# Patient Record
Sex: Female | Born: 1965 | Race: White | Hispanic: No | Marital: Married | Smoking: Never smoker
Health system: Southern US, Community
[De-identification: ages and names within clinical notes are randomized; demographics above are authoritative.]

## PROBLEM LIST (undated history)

## (undated) DIAGNOSIS — R7611 Nonspecific reaction to tuberculin skin test without active tuberculosis: Secondary | ICD-10-CM

## (undated) DIAGNOSIS — H7390 Unspecified disorder of tympanic membrane, unspecified ear: Secondary | ICD-10-CM

## (undated) DIAGNOSIS — E042 Nontoxic multinodular goiter: Secondary | ICD-10-CM

## (undated) HISTORY — PX: NO PAST SURGERIES: SHX2092

## (undated) HISTORY — DX: Nontoxic multinodular goiter: E04.2

## (undated) HISTORY — DX: Unspecified disorder of tympanic membrane, unspecified ear: H73.90

## (undated) HISTORY — DX: Nonspecific reaction to tuberculin skin test without active tuberculosis: R76.11

## (undated) HISTORY — PX: ENDOMETRIAL ABLATION: SHX621

---

## 1996-07-21 ENCOUNTER — Encounter: Payer: Self-pay | Admitting: Internal Medicine

## 2008-01-07 DIAGNOSIS — H7390 Unspecified disorder of tympanic membrane, unspecified ear: Secondary | ICD-10-CM

## 2008-01-07 HISTORY — DX: Unspecified disorder of tympanic membrane, unspecified ear: H73.90

## 2008-03-13 ENCOUNTER — Ambulatory Visit: Payer: Self-pay | Admitting: Internal Medicine

## 2008-10-28 ENCOUNTER — Ambulatory Visit: Payer: Self-pay | Admitting: Internal Medicine

## 2008-10-28 DIAGNOSIS — J019 Acute sinusitis, unspecified: Secondary | ICD-10-CM

## 2008-10-28 DIAGNOSIS — H6980 Other specified disorders of Eustachian tube, unspecified ear: Secondary | ICD-10-CM

## 2009-02-15 ENCOUNTER — Ambulatory Visit: Payer: Self-pay | Admitting: Internal Medicine

## 2009-02-15 DIAGNOSIS — J069 Acute upper respiratory infection, unspecified: Secondary | ICD-10-CM | POA: Insufficient documentation

## 2009-02-19 ENCOUNTER — Telehealth: Payer: Self-pay | Admitting: Internal Medicine

## 2009-03-06 ENCOUNTER — Ambulatory Visit: Payer: Self-pay | Admitting: Internal Medicine

## 2009-03-06 ENCOUNTER — Telehealth: Payer: Self-pay

## 2009-03-06 DIAGNOSIS — L738 Other specified follicular disorders: Secondary | ICD-10-CM

## 2009-05-14 ENCOUNTER — Telehealth: Payer: Self-pay | Admitting: Internal Medicine

## 2009-06-27 ENCOUNTER — Encounter: Admission: RE | Admit: 2009-06-27 | Discharge: 2009-06-27 | Payer: Self-pay | Admitting: Obstetrics and Gynecology

## 2009-08-07 ENCOUNTER — Encounter: Admission: RE | Admit: 2009-08-07 | Discharge: 2009-08-07 | Payer: Self-pay | Admitting: Obstetrics and Gynecology

## 2010-02-05 NOTE — Assessment & Plan Note (Signed)
Summary: HEAD AND CHEST CONGESTION/COUGH/CJR   Vital Signs:  Patient profile:   45 year old female Weight:      135 pounds Temp:     98.6 degrees F oral BP sitting:   110 / 70  (left arm) Cuff size:   regular  Vitals Entered By: Duard Brady RN (February 15, 2009 10:10 AM) CC: c/o cough - waking , congestion head and chest , low grade fever , son sick last wk    CC:  c/o cough - waking , congestion head and chest , low grade fever , and son sick last wk .  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  She presents with a one week history of head and chest congestion.  Her chief complaint is cough that interferes with sleep and there is an earlier fever.  That has resolved.  She does have some nasal drainage, but no productive cough.  Denies any wheezing or shortness of breath.  She has had a history of sinus infection and also ruptured  tympanic membrane in the past  Allergies: No Known Drug Allergies  Past History:  Past Medical History: Reviewed history from 10/28/2008 and no changes required. 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  The patient denies weight loss, weight gain, vision loss, decreased hearing, hoarseness, chest pain, syncope, dyspnea on exertion, peripheral edema, headaches, hemoptysis, abdominal pain, melena, hematochezia, severe indigestion/heartburn, hematuria, incontinence, genital sores, muscle weakness, suspicious skin lesions, transient blindness, difficulty walking, depression, unusual weight change, abnormal bleeding, enlarged lymph nodes, angioedema, and breast masses.    Physical Exam  General:  Well-developed,well-nourished,in no acute distress; alert,appropriate and cooperative throughout examination Head:  Normocephalic and  atraumatic without obvious abnormalities. No apparent alopecia or balding. Eyes:  No corneal or conjunctival inflammation noted. EOMI. Perrla. Funduscopic exam benign, without hemorrhages, exudates or papilledema. Vision grossly normal. Ears:  External ear exam shows no significant lesions or deformities.  Otoscopic examination reveals clear canals, tympanic membranes are intact bilaterally without bulging, retraction, inflammation or discharge. Hearing is grossly normal bilaterally. Mouth:  Oral mucosa and oropharynx without lesions or exudates.  Teeth in good repair. Neck:  No deformities, masses, or tenderness noted. Lungs:  rare scattered rhonchinormal respiratory effort, no intercostal retractions, and no accessory muscle use.  normal respiratory effort, no intercostal retractions, and no accessory muscle use.   Heart:  Normal rate and regular rhythm. S1 and S2 normal without gallop, murmur, click, rub or other extra sounds.   Complete Medication List: 1)  Hydrocodone-homatropine 5-1.5 Mg/33ml Syrp (Hydrocodone-homatropine) .Marland Kitchen.. 1 teaspoon every 6 hours as needed for cough  Patient Instructions: 1)  Get plenty of rest, drink lots of clear liquids, and use Tylenol or Ibuprofen for fever and comfort. Return in 7-10 days if you're not better:sooner if you're feeling worse. Prescriptions: HYDROCODONE-HOMATROPINE 5-1.5 MG/5ML SYRP (HYDROCODONE-HOMATROPINE) 1 teaspoon every 6 hours as needed for cough  #6 oz x 0   Entered and Authorized by:   Gordy Savers  MD   Signed by:   Gordy Savers  MD on 02/15/2009   Method used:   Print then Give to Patient   RxID:   [id number]   Appended Document: HEAD AND CHEST CONGESTION/COUGH/CJR Impression:  Viral  URI  Cough  Plan:  will treat symptomatically with anti-tussives, force fluids, rest, and anti-inflammatories

## 2010-02-05 NOTE — Progress Notes (Signed)
Summary:   [facility] 02/19/2009  Phone Note Call from Patient   Caller: Patient Call For: Gordy Savers  MD Summary of Call: Pt is having lots of yellow and brown mucus with coughing.  Cannot take the cough meds during the day, and would like an antibioitic. Sil[facility] [phone] Initial call taken by: Lynann Beaver CMA,  February 19, 2009 8:48 AM    New/Updated Medications: DOXYCYCLINE HYCLATE 100 MG CAPS (DOXYCYCLINE HYCLATE) one by mouth two times a day x 10 days Prescriptions: DOXYCYCLINE HYCLATE 100 MG CAPS (DOXYCYCLINE HYCLATE) one by mouth two times a day x 10 days  #20 x 0   Entered by:   Lynann Beaver CMA   Authorized by:   Gordy Savers  MD   Signed by:   Lynann Beaver CMA on 02/19/2009   Method used:   Electronically to        [facility]* (retail)       [address] [place], [place]  [address]       Ph: [phone] or [phone]       Fax: [phone]   RxID:   [id number]  doxycycline  100 #20 one twice daily  per Dr. Kirtland Bouchard Pt notified

## 2010-02-05 NOTE — Assessment & Plan Note (Signed)
Summary: COUGH, CONGESTION, ST // RS   Vital Signs:  Patient profile:   45 year old female Weight:      135 pounds Temp:     98.7 degrees F oral BP sitting:   120 / 70  (left arm) Cuff size:   regular  Vitals Entered By: Duard Brady LPN (March 06, 3873 10:08 AM) CC: c.o still was cough , sore throat - finished abx x1wk ago - would like rx for nasal spray  , also c/o chin and neck irratation (not a new problem) Is Patient Diabetic? No   CC:  c.o still was cough , sore throat - finished abx x1wk ago - would like rx for nasal spray  , and also c/o chin and neck irratation (not a new problem).  History of Present Illness: 45 year old patient who is seen today for follow-up.  She was treated for a URI earlier and more recently has developed recurrent cough and sore throat.  She describes a minimal nasal congestion as well.  She also describes a history of acne or  rosacea that has responded to oral antibiotics in the past.  She describes an outbreak of inflammatory nodules involving the face and neck area.  Denies any fever.  Allergies (verified): No Known Drug Allergies  Past History:  Past Medical History: Reviewed history from 10/28/2008 and no changes required. 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  The patient denies anorexia, fever, weight loss, weight gain, vision loss, decreased hearing, chest pain, syncope, dyspnea on exertion, peripheral edema, headaches, hemoptysis, abdominal pain, melena, hematochezia, severe indigestion/heartburn, hematuria, incontinence, genital sores, muscle weakness, suspicious skin lesions, transient blindness, difficulty walking, depression, unusual weight change, abnormal bleeding, enlarged lymph nodes, angioedema, and breast masses.    Physical Exam  General:  Well-developed,well-nourished,in no acute distress; alert,appropriate and cooperative  throughout examination Head:  Normocephalic and atraumatic without obvious abnormalities. No apparent alopecia or balding. Eyes:  No corneal or conjunctival inflammation noted. EOMI. Perrla. Funduscopic exam benign, without hemorrhages, exudates or papilledema. Vision grossly normal. Ears:  External ear exam shows no significant lesions or deformities.  Otoscopic examination reveals clear canals, tympanic membranes are intact bilaterally without bulging, retraction, inflammation or discharge. Hearing is grossly normal bilaterally. Nose:  External nasal examination shows no deformity or inflammation. Nasal mucosa are pink and moist without lesions or exudates. Mouth:  Oral mucosa and oropharynx without lesions or exudates.  Teeth in good repair. Neck:  No deformities, masses, or tenderness noted. Lungs:  Normal respiratory effort, chest expands symmetrically. Lungs are clear to auscultation, no crackles or wheezes. Skin:  a few scattered inflammatory papules involving the chin and neck regions   Impression & Recommendations:  Problem # 1:  URI (ICD-465.9)  Her updated medication list for this problem includes:    Hydrocodone-homatropine 5-1.5 Mg/44ml Syrp (Hydrocodone-homatropine) .Marland Kitchen... 1 teaspoon every 6 hours as needed for cough  Her updated medication list for this problem includes:    Hydrocodone-homatropine 5-1.5 Mg/19ml Syrp (Hydrocodone-homatropine) .Marland Kitchen... 1 teaspoon every 6 hours as needed for cough  Problem # 2:  ? of ALLERGIC RHINITIS (ICD-477.9)  Her updated medication list for this problem includes:    Fluticasone Propionate 50 Mcg/act Susp (Fluticasone propionate) ..... Use daily  Her updated medication list for this problem includes:    Fluticasone Propionate 50 Mcg/act Susp (Fluticasone propionate) ..... Use daily  Problem # 3:  FOLLICULITIS (ICD-704.8)  Complete Medication List: 1)  Hydrocodone-homatropine 5-1.5 Mg/12ml Syrp (Hydrocodone-homatropine) .Marland Kitchen.. 1 teaspoon every 6  hours as needed for cough 2)  Doxycycline Hyclate 100 Mg Caps (Doxycycline hyclate) .... One twice daily 3)  Fluticasone Propionate 50 Mcg/act Susp (Fluticasone propionate) .... Use daily  Patient Instructions: 1)  Get plenty of rest, drink lots of clear liquids, and use Tylenol or Ibuprofen for fever and comfort. Return in 7-10 days if you're not better:sooner if you're feeling worse. Prescriptions: FLUTICASONE PROPIONATE 50 MCG/ACT SUSP (FLUTICASONE PROPIONATE) use daily  #1 x 6   Entered and Authorized by:   Gordy Savers  MD   Signed by:   Gordy Savers  MD on 03/06/2009   Method used:   Print then Give to Patient   RxID:   [id number] DOXYCYCLINE HYCLATE 100 MG CAPS (DOXYCYCLINE HYCLATE) one twice daily  #20 x 0   Entered and Authorized by:   Gordy Savers  MD   Signed by:   Gordy Savers  MD on 03/06/2009   Method used:   Print then Give to Patient   RxID:   [id number] HYDROCODONE-HOMATROPINE 5-1.5 MG/5ML SYRP (HYDROCODONE-HOMATROPINE) 1 teaspoon every 6 hours as needed for cough  #6 oz x 0   Entered and Authorized by:   Gordy Savers  MD   Signed by:   Gordy Savers  MD on 03/06/2009   Method used:   Print then Give to Patient   RxID:   [id number]

## 2010-02-05 NOTE — Progress Notes (Signed)
Summary: still coughing  Phone Note Call from Patient Call back at [phone]   Caller: Patient----voicemail Reason for Call: Talk to Nurse Summary of Call: Dena finished abx about a week ago. Still coughing with sore throat. Not too red. Always clearing her throat. Not coughing as much at night. Initial call taken by: Warnell Forester,  March 06, 2009 9:18 AM  Follow-up for Phone Call        attempt to call - LMTCB - needs appt today  to re eval . Call and make appt.  KIK Follow-up by: Duard Brady LPN,  March 06, 2009 9:27 AM

## 2010-02-05 NOTE — Progress Notes (Signed)
Summary: RX for rash  Phone Note Call from Patient   Caller: Patient Call For: Gordy Savers  MD Summary of Call: Pt has whelps and rash on face and is asking Dr. Kirtland Bouchard to call in RX such as a cream or lotion.  It was getting better on the antibiotic, but got worse when stopping it.  [phone] Initial call taken by: Lynann Beaver CMA,  May 14, 2009 3:59 PM  Follow-up for Phone Call        Metrogel  60 gms  apply twice daily; doxyycycline 100  #30 one daily Follow-up by: Gordy Savers  MD,  May 14, 2009 4:46 PM    New/Updated Medications: METROGEL 1 % GEL (METRONIDAZOLE) use twice daily Prescriptions: METROGEL 1 % GEL (METRONIDAZOLE) use twice daily  #1 wk supply x 0   Entered by:   Lynann Beaver CMA   Authorized by:   Gordy Savers  MD   Signed by:   Lynann Beaver CMA on 05/14/2009   Method used:   Electronically to        [facility]* (retail)       [address] [place], [place]  [address]       Ph: [phone] or [phone]       Fax: [phone]   RxID:   [id number] DOXYCYCLINE HYCLATE 100 MG CAPS (DOXYCYCLINE HYCLATE) one twice daily  #30 x 0   Entered by:   Lynann Beaver CMA   Authorized by:   Gordy Savers  MD   Signed by:   Lynann Beaver CMA on 05/14/2009   Method used:   Electronically to        [facility]* (retail)       [address] [place], [place]  [address]       Ph: [phone] or [phone]       Fax: [phone]   RxID:   [id number]

## 2010-02-16 ENCOUNTER — Ambulatory Visit (INDEPENDENT_AMBULATORY_CARE_PROVIDER_SITE_OTHER): Payer: 59 | Admitting: Family Medicine

## 2010-02-16 ENCOUNTER — Encounter: Payer: Self-pay | Admitting: Family Medicine

## 2010-02-16 DIAGNOSIS — J050 Acute obstructive laryngitis [croup]: Secondary | ICD-10-CM

## 2010-02-21 NOTE — Assessment & Plan Note (Addendum)
Summary: COLD/VJ   Vital Signs:  Patient profile:   45 year old female Weight:      134.50 pounds Temp:     98.6 degrees F oral Pulse rate:   74 / minute Pulse rhythm:   regular BP sitting:   128 / 80  (left arm) Cuff size:   regular  Vitals Entered By: Selena Batten Dance CMA Duncan Dull) (February 16, 2010 10:44 AM) CC: Cold   CC:  Cold.  History of Present Illness: 45 y/o WF with CC of cough. Onset of nasal congestion and mild cough about 2 weeks ago.  The nasal congestion/drainage has improved but the cough is more prominent lately.  Thole a trace of ST.  No mucous production.  Has had hoarseness and some question of wheezing lately.  No SOB.  No fever.  No n/v/d or rash. Nonsmoker. No history of asthma/lung dz. Use of fluticasone and antihistamine lately has helped with the upper resp component of this.  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 Gen: Alert, well appearing, oriented x 4. HEENT: Scalp without lesions or hair loss.  Ears: EACs clear, normal epithelium.  TMs with good light reflex and landmarks bilaterally.  Eyes: no injection, icteris, swelling, or exudate.  EOMI, PERRLA. Nose: no drainage or turbinate edema/swelling.  No injection or focal lesion.  Mouth: lips without lesion/swelling.  Oral mucosa pink and moist.  Dentition intact and without obvious caries or gingival swelling.  Oropharynx without erythema, exudate, or swelling.  Neck: supple.  No lymphadenopathy,  thyromegaly, or mass. Chest: symmetric expansion, with nonlabored respirations.  Clear and equal breath sounds in all lung fields.  Forced exhalation brought out a tiny exp wheeze--?localized in tracheal region mostly. CV: RRR, no m/r/g.  Peripheral pulses 2+/symmetric. EXT: no clubbing, cyanosis, or edema.     Impression & Recommendations:  Problem # 1:  LARYNGOTRACHEOBRONCHITIS, ACUTE (ICD-464.4) Assessment New  Albuterol 2.5mg  neb in office today: minimal improvement in pt's feeling of wheezing.  Again, no wheezing or prolongation of exp phase was heard on exam before or after her albut neb today. I asked her to try symbicort 160/4.5 2 puffs two times a day x 2 wks IF she felt improvement in about an hour from her albuterol neb. If no improvement felt in 1 hour, don't try the symbicort. Self limited nature of illness discussed.  Symptom managment discussed. Encouraged pt to get flu vaccine ASAP (we are out of flu vaccine here today).   Orders: Albuterol Sulfate Sol 1mg  unit dose (E4540) Nebulizer Tx (98119)  Complete Medication List: 1)  Fluticasone Propionate 50 Mcg/act Susp (Fluticasone propionate) .... Use daily  Patient Instructions: 1)  Use OTC saline nasal spray and continue OTC antihistamine and your fluticasone. 2)  Call or return if no improvement in 7-10d, or if worsening before that. 3)  Try symbicort sample as instructed.   Medication Administration  Medication # 1:    Medication: Albuterol Sulfate Sol 1mg  unit dose    Diagnosis: LARYNGOTRACHEOBRONCHITIS, ACUTE (ICD-464.4)  Dose: 2.5 mg    Route: inhaled    Exp Date: 03/06/2011    Lot #: [id number]     Mfr: Nephron    Comments: Per Dr. Marvel Plan    Patient tolerated medication without complications    Given by: Selena Batten Dance CMA Duncan Dull) (February 16, 2010 11:25 AM)  Orders Added: 1)  Albuterol Sulfate Sol 1mg  unit dose [J7613] 2)  Nebulizer Tx [94640] 3)  Est. Patient Level III [95621]    Current Allergies  (reviewed today): No known allergies

## 2010-04-10 ENCOUNTER — Other Ambulatory Visit: Payer: Self-pay | Admitting: Internal Medicine

## 2010-06-28 ENCOUNTER — Telehealth: Payer: Self-pay | Admitting: Internal Medicine

## 2010-06-28 NOTE — Telephone Encounter (Signed)
ok 

## 2010-06-28 NOTE — Telephone Encounter (Signed)
Pt called and would like to change pcp from Dr Amador Cunas to Dr Fabian Sharp, because pts children are pts of Dr Fabian Sharp. Pls advise if ok.

## 2010-07-01 NOTE — Telephone Encounter (Signed)
Ok

## 2010-07-04 NOTE — Telephone Encounter (Signed)
Called pt and lft a vm [id number] of pcp change.

## 2010-10-22 ENCOUNTER — Other Ambulatory Visit: Payer: Self-pay | Admitting: Internal Medicine

## 2010-10-22 DIAGNOSIS — Z0000 Encounter for general adult medical examination without abnormal findings: Secondary | ICD-10-CM

## 2010-11-12 ENCOUNTER — Other Ambulatory Visit (INDEPENDENT_AMBULATORY_CARE_PROVIDER_SITE_OTHER): Payer: PRIVATE HEALTH INSURANCE

## 2010-11-12 DIAGNOSIS — Z0000 Encounter for general adult medical examination without abnormal findings: Secondary | ICD-10-CM

## 2010-11-12 LAB — LIPID PANEL: VLDL: 11 mg/dL (ref 0.0–40.0)

## 2010-11-12 LAB — CBC WITH DIFFERENTIAL/PLATELET
Basophils Absolute: 0 10*3/uL (ref 0.0–0.1)
Basophils Relative: 0.5 % (ref 0.0–3.0)
Eosinophils Absolute: 0.1 10*3/uL (ref 0.0–0.7)
HCT: 44.3 % (ref 36.0–46.0)
Lymphocytes Relative: 23.5 % (ref 12.0–46.0)
MCHC: 34.1 g/dL (ref 30.0–36.0)
MCV: 93.6 fl (ref 78.0–100.0)
Monocytes Absolute: 0.4 10*3/uL (ref 0.1–1.0)
Monocytes Relative: 9.7 % (ref 3.0–12.0)
Platelets: 282 10*3/uL (ref 150.0–400.0)
RBC: 4.73 Mil/uL (ref 3.87–5.11)

## 2010-11-12 LAB — HEPATIC FUNCTION PANEL
AST: 22 U/L (ref 0–37)
Albumin: 4.4 g/dL (ref 3.5–5.2)
Bilirubin, Direct: 0.1 mg/dL (ref 0.0–0.3)
Total Bilirubin: 1 mg/dL (ref 0.3–1.2)

## 2010-11-12 LAB — BASIC METABOLIC PANEL
BUN: 13 mg/dL (ref 6–23)
CO2: 28 mEq/L (ref 19–32)
Calcium: 9.3 mg/dL (ref 8.4–10.5)
Sodium: 141 mEq/L (ref 135–145)

## 2010-12-09 ENCOUNTER — Encounter: Payer: Self-pay | Admitting: Internal Medicine

## 2010-12-10 ENCOUNTER — Encounter: Payer: Self-pay | Admitting: Internal Medicine

## 2010-12-10 ENCOUNTER — Ambulatory Visit (INDEPENDENT_AMBULATORY_CARE_PROVIDER_SITE_OTHER): Payer: BC Managed Care – PPO | Admitting: Internal Medicine

## 2010-12-10 VITALS — BP 120/80 | HR 66 | Ht 66.25 in | Wt 134.0 lb

## 2010-12-10 DIAGNOSIS — L710 Perioral dermatitis: Secondary | ICD-10-CM

## 2010-12-10 DIAGNOSIS — R0989 Other specified symptoms and signs involving the circulatory and respiratory systems: Secondary | ICD-10-CM

## 2010-12-10 DIAGNOSIS — Z23 Encounter for immunization: Secondary | ICD-10-CM

## 2010-12-10 DIAGNOSIS — L709 Acne, unspecified: Secondary | ICD-10-CM | POA: Insufficient documentation

## 2010-12-10 DIAGNOSIS — J309 Allergic rhinitis, unspecified: Secondary | ICD-10-CM | POA: Insufficient documentation

## 2010-12-10 DIAGNOSIS — Z0000 Encounter for general adult medical examination without abnormal findings: Secondary | ICD-10-CM

## 2010-12-10 MED ORDER — FLUTICASONE PROPIONATE 50 MCG/ACT NA SUSP: 2.0000 | Freq: Every day | NASAL | Status: DC

## 2010-12-10 MED ORDER — CLINDAMYCIN PHOSPHATE 1 % EX SOLN: CUTANEOUS | Status: AC

## 2010-12-10 NOTE — Progress Notes (Signed)
Subjective:    Patient ID: Cheryl Petersen, female    DOB: 10-03-1965, 45 y.o.   MRN: [id number]  HPI  Patient comes in today for Preventive Health Care visit  She had been seeing dr Kirtland Bouchard and transfer of care. Generally well a few issues of report . Out of nose spray. flonase   On zyrtec   Some chronic congestion no cough or real sob.  For a year has had some  positional breathing sounds when tilts head back .   No sob. Or cough.  NOt associated with exercise. No ha vision change pulsatile sounds or hearing issue.  First noted after  Ablation.   No progression . Unsure if this is serious or not; is not progressive.  Other wise feels ok.  Review of Systems ROS:  GEN/ HEENTNo fever, significant weight changes sweats headaches vision problems hearing changes,some throat.  clearing at times.  CV/ PULM; No chest pain shortness of breath cough, syncope,edema  change in exercise tolerance. GI /GU: No adominal pain, vomiting, change in bowel habits. No blood in the stool. No significant GU symptoms. SKIN/HEME: ,no acute skin rashes suspicious lesions or bleeding. No lymphadenopathy, nodules, masses.   Face bumps some worse on retina per  Derm  Last seen in summer  NEURO/ PSYCH:  No neurologic signs such as weakness numbness No depression anxiety. IMM/ Allergy: No unusual infections.  Allergy .   hxof allergy and seasonal allergy.   REST of 12 system review negative  Past Medical History  Diagnosis Date  . Positive PPD   . 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 Allergic rhinitis     History   Social History  . Marital Status: Married    Spouse Name: N/A    Number of Children: N/A  . Years of Education: N/A   Occupational History  . Not on file.   Social History Main Topics  . Smoking status: Never Smoker   . Smokeless tobacco: Not on file  . Alcohol Use: No  . Drug Use: No  . Sexually Active: Not on file   Other Topics Concern  . 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 Two children age 24 and 26    Past Surgical History  Procedure Date  . No past surgeries   . Endometrial ablation     2011    Family History  Problem Relation Age of Onset  . Healthy Mother   . Hypertension Father   . Hyperlipidemia Father     No Known Allergies  No current outpatient prescriptions on file prior to visit.    BP 120/80  Pulse 66  Ht 5' 6.25" (1.683 m)  Wt 134 lb (60.782 kg)  BMI 21.47 kg/m2  LMP 11/26/2010       Objective:   Physical Exam Physical Exam: Vital signs reviewed WUJ:WJXB is a well-developed well-nourished alert cooperative  white female who appears her stated age in no acute distress.  HEENT: normocephalic  traumatic , Eyes: PERRL EOM's full, conjunctiva clear, Nares: paten,t no deformity discharge or tenderness.,mild congestion Ears: no deformity EAC's clear TMs with normal landmarks. Mouth: clear OP, no lesions, edema.  Moist mucous membranes. Dentition in adequate repair. NECK: supple without masses, thyromegaly  high pitched pulsatile squeak that seems pulsatile related to pulse than respiration  On right more obvious when deep breathing and had back. CHEST/PULM:  Clear to auscultation and percussion breath sounds equal  no wheeze , rales or rhonchi. No chest wall deformities or tenderness. CV: PMI is nondisplaced, S1 S2 no gallops, murmurs, rubs. Peripheral pulses are full without delay.No JVD .  ABDOMEN: Bowel sounds normal nontender  No guard or rebound, no hepato splenomegal no CVA tenderness.  No hernia. Extremtities:  No clubbing cyanosis or edema, no acute joint swelling or redness no focal atrophy NEURO:  Oriented x3, cranial nerves 3-12 appear to be intact, no obvious focal weakness,gait within normal limits no abnormal reflexes or asymmetrical SKIN: No acute rashes normal turgor, color, no bruising or petechiae. Face few papules and one inflammatory lesion left face .     PSYCH: Oriented, good eye contact, no obvious depression anxiety, cognition and judgment appear normal. Lab Results  Component Value Date   WBC 4.2* 11/12/2010   HGB 15.1* 11/12/2010   HCT 44.3 11/12/2010   PLT 282.0 11/12/2010   GLUCOSE 91 11/12/2010   CHOL 177 11/12/2010   TRIG 55.0 11/12/2010   HDL 66.70 11/12/2010   LDLCALC 99 11/12/2010   ALT 15 11/12/2010   AST 22 11/12/2010   NA 141 11/12/2010   K 4.1 11/12/2010   CL 106 11/12/2010   CREATININE 1.0 11/12/2010   BUN 13 11/12/2010   CO2 28 11/12/2010   TSH 1.22 11/12/2010        Assessment & Plan:  Preventive Health Care Counseled regarding healthy nutrition, exercise, sleep, injury prevention, calcium vit d and healthy weight . Allergic rhinitis Go back on flonase and monitor airway issues etc.  ? Bruit   Right upper pitched squeak   Positional   No sx but noticed for a year .  Think this is a vascular sound? But positional  .   Get doppler .  Consider ent  Check if progressive  Adult acne perioral dermatitis   Worse since saw derm in summer  Trial add  cleocin topical if not better then see her derm.

## 2010-12-10 NOTE — Patient Instructions (Signed)
Continue lifestyle intervention healthy eating and exercise . Begin flonase daily Will contact you about doppler test of neck arteries   And contact  you about results . Can try topical antibiotic 1-2 x per day and of not getting better fu with your dermatologist .

## 2010-12-27 ENCOUNTER — Other Ambulatory Visit: Payer: Self-pay | Admitting: *Deleted

## 2010-12-27 DIAGNOSIS — R0989 Other specified symptoms and signs involving the circulatory and respiratory systems: Secondary | ICD-10-CM

## 2010-12-30 ENCOUNTER — Encounter: Payer: PRIVATE HEALTH INSURANCE | Admitting: Internal Medicine

## 2010-12-30 ENCOUNTER — Encounter (INDEPENDENT_AMBULATORY_CARE_PROVIDER_SITE_OTHER): Payer: BC Managed Care – PPO | Admitting: Cardiology

## 2010-12-30 DIAGNOSIS — R0989 Other specified symptoms and signs involving the circulatory and respiratory systems: Secondary | ICD-10-CM

## 2011-01-08 ENCOUNTER — Encounter: Payer: PRIVATE HEALTH INSURANCE | Admitting: Internal Medicine

## 2011-01-09 ENCOUNTER — Other Ambulatory Visit: Payer: Self-pay | Admitting: Internal Medicine

## 2011-01-09 DIAGNOSIS — E010 Iodine-deficiency related diffuse (endemic) goiter: Secondary | ICD-10-CM

## 2011-01-09 NOTE — Progress Notes (Signed)
Quick Note:  Pt aware of results. Order placed in epic. ______ 

## 2011-01-09 NOTE — Progress Notes (Signed)
Quick Note:  Left message to call back. ______ 

## 2011-01-14 ENCOUNTER — Ambulatory Visit
Admission: RE | Admit: 2011-01-14 | Discharge: 2011-01-14 | Disposition: A | Discharge status: Home / Self Care | Payer: BC Managed Care – PPO | Source: Ambulatory Visit | Attending: Internal Medicine | Admitting: Internal Medicine

## 2011-01-14 DIAGNOSIS — E010 Iodine-deficiency related diffuse (endemic) goiter: Secondary | ICD-10-CM

## 2011-01-15 ENCOUNTER — Encounter: Payer: Self-pay | Admitting: Internal Medicine

## 2011-01-15 DIAGNOSIS — E041 Nontoxic single thyroid nodule: Secondary | ICD-10-CM | POA: Insufficient documentation

## 2011-01-16 NOTE — Progress Notes (Signed)
Quick Note:  Left message to call back. ______ 

## 2011-01-27 ENCOUNTER — Telehealth: Payer: Self-pay | Admitting: Internal Medicine

## 2011-01-27 NOTE — Telephone Encounter (Signed)
Requesting results of her ultrasound that was done 2 weeks ago. Thanks.

## 2011-01-27 NOTE — Progress Notes (Signed)
Quick Note:  Pt aware of lab results and states she would like to see the thyroid specialist. ______

## 2011-01-27 NOTE — Telephone Encounter (Signed)
Pt aware of ultrasound results 

## 2011-01-28 NOTE — Progress Notes (Signed)
Quick Note:  See phone notes ______

## 2011-01-29 ENCOUNTER — Telehealth: Payer: Self-pay | Admitting: Internal Medicine

## 2011-01-29 NOTE — Telephone Encounter (Signed)
Chart opened in error

## 2011-01-30 ENCOUNTER — Telehealth: Payer: Self-pay | Admitting: *Deleted

## 2011-01-30 DIAGNOSIS — E041 Nontoxic single thyroid nodule: Secondary | ICD-10-CM

## 2011-01-30 DIAGNOSIS — E049 Nontoxic goiter, unspecified: Secondary | ICD-10-CM

## 2011-01-30 DIAGNOSIS — R0989 Other specified symptoms and signs involving the circulatory and respiratory systems: Secondary | ICD-10-CM

## 2011-01-30 NOTE — Telephone Encounter (Signed)
(  voicemail)  Pt calling to check status of referral to endocrinologist.

## 2011-01-30 NOTE — Telephone Encounter (Signed)
This pt is calling re: a referral to an Endocrinologist.  I do not currently have a referral for this pt. Carollee Herter, please call your pt to discuss. Thanks.

## 2011-01-30 NOTE — Telephone Encounter (Signed)
Please send copy of doppler  Last labs and notes and ultrasound of thyroid. thanks

## 2011-01-30 NOTE — Telephone Encounter (Signed)
i dont know why the referral didn't happen( see my notes ) I will put it in myself.  Ok to do referral .  Thyroid nodules one should be bxed .

## 2011-03-31 ENCOUNTER — Other Ambulatory Visit: Payer: Self-pay | Admitting: Endocrinology

## 2011-03-31 DIAGNOSIS — E041 Nontoxic single thyroid nodule: Secondary | ICD-10-CM

## 2011-04-03 ENCOUNTER — Ambulatory Visit
Admission: RE | Admit: 2011-04-03 | Discharge: 2011-04-03 | Disposition: A | Discharge status: Home / Self Care | Payer: BC Managed Care – PPO | Source: Ambulatory Visit | Attending: Endocrinology | Admitting: Endocrinology

## 2011-04-03 ENCOUNTER — Other Ambulatory Visit (HOSPITAL_COMMUNITY)
Admission: RE | Admit: 2011-04-03 | Discharge: 2011-04-03 | Disposition: A | Discharge status: Home / Self Care | Payer: BC Managed Care – PPO | Source: Ambulatory Visit | Attending: Interventional Radiology | Admitting: Interventional Radiology

## 2011-04-03 DIAGNOSIS — E049 Nontoxic goiter, unspecified: Secondary | ICD-10-CM | POA: Insufficient documentation

## 2011-04-03 DIAGNOSIS — E041 Nontoxic single thyroid nodule: Secondary | ICD-10-CM

## 2011-05-05 ENCOUNTER — Other Ambulatory Visit: Payer: Self-pay | Admitting: Internal Medicine

## 2011-05-05 ENCOUNTER — Encounter: Payer: Self-pay | Admitting: Internal Medicine

## 2011-05-05 ENCOUNTER — Ambulatory Visit (INDEPENDENT_AMBULATORY_CARE_PROVIDER_SITE_OTHER): Payer: BC Managed Care – PPO | Admitting: Internal Medicine

## 2011-05-05 VITALS — BP 128/80 | HR 75 | Temp 98.6°F | Wt 135.0 lb

## 2011-05-05 DIAGNOSIS — E041 Nontoxic single thyroid nodule: Secondary | ICD-10-CM

## 2011-05-05 DIAGNOSIS — R0989 Other specified symptoms and signs involving the circulatory and respiratory systems: Secondary | ICD-10-CM

## 2011-05-05 DIAGNOSIS — R238 Other skin changes: Secondary | ICD-10-CM

## 2011-05-05 DIAGNOSIS — T148XXA Other injury of unspecified body region, initial encounter: Secondary | ICD-10-CM

## 2011-05-05 MED ORDER — CEPHALEXIN 500 MG PO CAPS: 500.0000 mg | ORAL_CAPSULE | Freq: Two times a day (BID) | ORAL | Status: AC

## 2011-05-05 NOTE — Patient Instructions (Signed)
It is uncertain why you have the sound I hear in her neck associated with respiration. It could be a normal vascular sound but since it is new or for the last year we'll get further evaluation. I will review your record and have someone contact you about referral.  In regard to the blisters on her toes I am uncertain about why you have back also but we are going to treat you as if it is impetigo.   You can use Polysporin ointment on the area and at the antibiotic as we discussed.  It is not getting better or contact us we can take another look in consistent or consider getting dermatology to check your feet.

## 2011-05-08 LAB — WOUND CULTURE

## 2011-05-10 ENCOUNTER — Encounter: Payer: Self-pay | Admitting: Internal Medicine

## 2011-05-10 NOTE — Progress Notes (Signed)
  Subjective:    Patient ID: Cheryl Petersen, female    DOB: [birth year], 46 y.o.   MRN: [id number]  HPI Comes in today for what she calls a wheeze. Ever since her gyne surgery  Endometrial ablation  1 year ago she has heard a sound with breathing upper airway and usually exhaling . No pain and no sob dies have allergies but not related to them. Getting more obvious recently . No fever cp coryza and no meds change  Also has blisters on top of toes got this last year when wore sandals in spring but went away this time used calamine and no help. Has blisters on both feet. Denies any new shoes bare leather on the area trauma  Or other chemical change initially ? If itch now painful at blister areas. ? What to do . NO hx of same on hands.   Review of Systems No cp sob cough fever weight change exercise change syncope  Or change in  Health .  Past history family history social history reviewed in the electronic medical record.     Objective:   Physical Exam BP 128/80  Pulse 75  Temp(Src) 98.6 F (37 C) (Oral)  Wt 135 lb (61.236 kg)  SpO2 98%  LMP 04/29/2011 [facility]DWN in nad HEENT: Normocephalic ;atraumatic , Eyes;  PERRL, EOMs  Full, lids and conjunctiva clear,,Ears: no deformities, canals nl, TM landmarks normal, Nose: no deformity or discharge  Mouth : OP clear without lesion or edema . Neck  High pitched short sound intermittent left neck ? Associate with exp cycle but ? Pulsatile?  Chest:  Clear to A&P without wheezes rales or rhonchi CV:  S1-S2 no gallops or murmurs peripheral perfusion is normal No clubbing cyanosis or edema SKIN: toes  Dorsal surface with scattered blisters full thickness pink purplish and no pus and no vesicles no interdigital rash and soles are clear  mildly tender  No streaking      Assessment & Plan:   Sound with breathing in neck sounds vascular but related to respiration onset after ablation last year no associated sx at this time. Had doppler that apparently was ok and  thyroid bx of nodule that was benign.   Blisters on toes  No hx of contact derm curious and uncertain cause  Did cx after blister unroofed  And will add empiric antibiotic incase this is atypical impetigo.   If  persistent or progressive may need to get derm to evaluate.

## 2011-05-15 ENCOUNTER — Telehealth: Payer: Self-pay | Admitting: Internal Medicine

## 2011-05-15 NOTE — Progress Notes (Signed)
Quick Note:  Left a message for pt to return call. ______ 

## 2011-05-15 NOTE — Telephone Encounter (Addendum)
Pt is returning Cheryl Petersen call concerning wound culture results. Pt is aware nurse and MD is gone for today,however she is requesting her results today.

## 2011-05-15 NOTE — Telephone Encounter (Signed)
Patient is aware of lab result. 

## 2011-05-30 ENCOUNTER — Encounter: Payer: Self-pay | Admitting: Vascular Surgery

## 2011-06-03 ENCOUNTER — Encounter: Payer: Self-pay | Admitting: Vascular Surgery

## 2011-06-03 ENCOUNTER — Ambulatory Visit (INDEPENDENT_AMBULATORY_CARE_PROVIDER_SITE_OTHER): Payer: BC Managed Care – PPO | Admitting: Vascular Surgery

## 2011-06-03 VITALS — BP 121/76 | HR 78 | Resp 14 | Ht 66.0 in | Wt 135.0 lb

## 2011-06-03 DIAGNOSIS — R0989 Other specified symptoms and signs involving the circulatory and respiratory systems: Secondary | ICD-10-CM

## 2011-06-03 NOTE — Progress Notes (Signed)
Patient presents today for evaluation of her perception of unusual sound in her neck with respirations. This is referred to as a squeaking sound and she feels that it may be related to her heart rate is well. She reports this been present for approximately 2 years and recalls a beginning around the time of endometrial ablation surgery. He denies any prior amaurosis fugax transient ischemic attack or stroke. She is healthy with no history of cardiac disease. Does have a history of prior eustachian tube dysfunction and allergic rhinitis.  Past Medical History  Diagnosis Date  . Positive PPD   . 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 Allergic rhinitis     History  Substance Use Topics  . Smoking status: Never Smoker   . Smokeless tobacco: Not on file  . Alcohol Use: No    Family History  Problem Relation Age of Onset  . Healthy Mother   . Hypertension Father   . Hyperlipidemia Father     No Known Allergies  Current outpatient prescriptions:fluticasone (FLONASE) 50 MCG/ACT nasal spray, USE 2 SPRAYS IN EACH NOSTRIL EVERY DAY, Disp: 16 g, Rfl: 3;  cetirizine (ZYRTEC) 10 MG tablet, Take 10 mg by mouth daily.  , Disp: , Rfl: ;  clindamycin (CLEOCIN-T) 1 % external solution, Apply to affected area 2 times daily, Disp: 60 mL, Rfl: 0  BP 121/76  Pulse 78  Resp 14  Ht 5\' 6"  (1.676 m)  Wt 135 lb (61.236 kg)  BMI 21.79 kg/m2  SpO2 100%  LMP 04/29/2011  Body mass index is 21.79 kg/(m^2).       Review of systems: Completely negative aside from the history of present illness  Physical exam: Well-developed well-nourished white female in no acute distress Carotid arteries without bruits bilaterally. She has a normal nontender carotid bulb pulsation. Pulse status 2+ radial pulses bilaterally Skin without ulcers or rashes Heart regular rate and rhythm Chest clear bilaterally without any rales or rhonchi Neurologically she is grossly intact  Carotid duplex at [place] [facility]  reveals no evidence of carotids no stenosis or plaque  Impression and plan unusual sensation from the patient of hearing a squeaking sensation in her neck with respirations. Discuss it do not see any evidence of any arterial insufficiency. I do for that she is at no risk for carotid type symptoms. She was reassured and will see [place] again on an as-needed basis. I would not recommend serial duplex followups since this is a near normal exam.

## 2011-06-17 ENCOUNTER — Encounter: Payer: BC Managed Care – PPO | Admitting: Vascular Surgery

## 2011-07-07 ENCOUNTER — Encounter: Payer: Self-pay | Admitting: Gynecology

## 2011-07-17 ENCOUNTER — Ambulatory Visit: Payer: BC Managed Care – PPO | Admitting: Gynecology

## 2011-07-22 ENCOUNTER — Encounter: Payer: Self-pay | Admitting: Gynecology

## 2011-07-22 ENCOUNTER — Other Ambulatory Visit (HOSPITAL_COMMUNITY)
Admission: RE | Admit: 2011-07-22 | Discharge: 2011-07-22 | Disposition: A | Discharge status: Home / Self Care | Payer: BC Managed Care – PPO | Source: Ambulatory Visit | Attending: Gynecology | Admitting: Gynecology

## 2011-07-22 ENCOUNTER — Ambulatory Visit (INDEPENDENT_AMBULATORY_CARE_PROVIDER_SITE_OTHER): Payer: BC Managed Care – PPO | Admitting: Gynecology

## 2011-07-22 VITALS — BP 118/74 | Ht 65.0 in | Wt 135.0 lb

## 2011-07-22 DIAGNOSIS — Z9289 Personal history of other medical treatment: Secondary | ICD-10-CM | POA: Insufficient documentation

## 2011-07-22 DIAGNOSIS — Z228 Carrier of other infectious diseases: Secondary | ICD-10-CM

## 2011-07-22 DIAGNOSIS — Z01419 Encounter for gynecological examination (general) (routine) without abnormal findings: Secondary | ICD-10-CM | POA: Insufficient documentation

## 2011-07-22 NOTE — Patient Instructions (Addendum)

## 2011-07-22 NOTE — Progress Notes (Signed)
Cheryl Petersen Apr 26, 1965 [id number]   History:    46 y.o.  for annual gyn exam who is a new patient to the practice. She stated that her last Pap smear was in [place] and 2012 and she has always had normal Pap smears. She has been under the care of Dr. Fabian Sharp  Who is been doing her lab work. She is currently being evaluated for bilateral carotid bruits and has already seen evaluated by the vascular surgeons and now is in the process of getting an evaluation by the ENT. She has also had a thyroid scan and recent thyroid biopsy which was reported to be benign as well. She has a history in 2011 of a [employer] endometrial ablation. She has very light and regular cycles. Her mammogram was this year which was normal. She does her monthly self breast examination. 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 It has been several years since she's had a chest x-ray.  Past medical history,surgical history, family history and social history were all reviewed and documented in the EPIC chart.  Gynecologic History Patient's last menstrual period was 06/07/2011. Contraception: vasectomy Last Pap: 2012. Results were: normal Last mammogram: 2013. Results were: normal  Obstetric History OB History    Grav Para Term Preterm Abortions TAB SAB Ect Mult Living   3 2 2  1  1   2      # Outc Date GA Lbr Len/2nd Wgt Sex Del Anes PTL Lv   1 TRM     M SVD  No Yes   2 TRM     M CS  No Yes   3 SAB                ROS: A ROS was performed and pertinent positives and negatives are included in the history.  GENERAL: No fevers or chills. HEENT: No change in vision, no earache, sore throat or sinus congestion. NECK: No pain or stiffness. CARDIOVASCULAR: No chest pain or pressure. No palpitations. PULMONARY: No shortness of breath, cough or wheeze. GASTROINTESTINAL: No abdominal pain, nausea, vomiting or diarrhea, melena or bright red blood per rectum. GENITOURINARY: No urinary  frequency, urgency, hesitancy or dysuria. MUSCULOSKELETAL: No joint or muscle pain, no back pain, no recent trauma. DERMATOLOGIC: No rash, no itching, no lesions. ENDOCRINE: No polyuria, polydipsia, no heat or cold intolerance. No recent change in weight. HEMATOLOGICAL: No anemia or easy bruising or bleeding. NEUROLOGIC: No headache, seizures, numbness, tingling or weakness. PSYCHIATRIC: No depression, no loss of interest in normal activity or change in sleep pattern.     Exam: chaperone present  BP 118/74  Ht 5\' 5"  (1.651 m)  Wt 135 lb (61.236 kg)  BMI 22.47 kg/m2  LMP 06/07/2011  Body mass index is 22.47 kg/(m^2).  General appearance : Well developed well nourished female. No acute distress HEENT: Neck supple, trachea midline, no carotid bruits, no thyroidmegaly Lungs: Clear to auscultation, no rhonchi or wheezes, or rib retractions  Heart: Regular rate and rhythm, no murmurs or gallops Breast:Examined in sitting and supine position were symmetrical in appearance, no palpable masses or tenderness,  no skin retraction, no nipple inversion, no nipple discharge, no skin discoloration, no axillary or supraclavicular lymphadenopathy Abdomen: no palpable masses or tenderness, no rebound or guarding Extremities: no edema or skin discoloration or tenderness  Pelvic:  Bartholin, Urethra, Skene Glands: Within normal limits  Vagina: No gross lesions or discharge  Cervix: No gross lesions or discharge  Uterus  retroverted, normal size, shape and consistency, non-tender and mobile  Adnexa  Without masses or tenderness  Anus and perineum  normal   Rectovaginal  normal sphincter tone without palpated masses or tenderness             Hemoccult not done     Assessment/Plan:  46 y.o. female for annual exam with prior history as a child for positive PPD conversion and treated. Since has been several years since her last chest x-ray we will order a chest x-ray PA and lateral. She will  continue to followup with her internist as part of evaluation for bilateral carotid bruits. Her labs are up-to-date so no additional labral or be drawn today. Pap smear was done today and we did discuss the new Pap smear screening guidelines. She was encouraged to do her monthly self breast examination. We discussed importance of calcium vitamin D as well as regular exercise for osteoporosis prevention.   Ok Edwards MD, 2:09 PM 07/22/2011

## 2011-07-28 ENCOUNTER — Telehealth: Payer: Self-pay | Admitting: Internal Medicine

## 2011-07-28 DIAGNOSIS — R0989 Other specified symptoms and signs involving the circulatory and respiratory systems: Secondary | ICD-10-CM

## 2011-07-28 NOTE — Telephone Encounter (Signed)
Can you refer to different specialist?  Do you need new order?

## 2011-07-28 NOTE — Telephone Encounter (Signed)
Pt called req to get referral re: wheezing noise in neck. Pt went to [facility] as suggested and the doctor there didn't hear anything. Still having problem. Req different referral to specialist.

## 2011-07-29 ENCOUNTER — Ambulatory Visit (HOSPITAL_COMMUNITY)
Admission: RE | Admit: 2011-07-29 | Discharge: 2011-07-29 | Disposition: A | Discharge status: Home / Self Care | Payer: BC Managed Care – PPO | Source: Ambulatory Visit | Attending: Gynecology | Admitting: Gynecology

## 2011-07-29 DIAGNOSIS — Z9289 Personal history of other medical treatment: Secondary | ICD-10-CM

## 2011-07-29 DIAGNOSIS — R7611 Nonspecific reaction to tuberculin skin test without active tuberculosis: Secondary | ICD-10-CM | POA: Insufficient documentation

## 2011-07-29 NOTE — Telephone Encounter (Signed)
The physician needs to make that decision, not me.  You will need to ask her.

## 2011-07-29 NOTE — Telephone Encounter (Signed)
Will need to review record

## 2011-07-30 ENCOUNTER — Telehealth: Payer: Self-pay | Admitting: *Deleted

## 2011-07-30 NOTE — Telephone Encounter (Signed)
PT INFORMED WITH NORMAL RECENT CHEST X-RAY RESULTS.

## 2011-07-30 NOTE — Telephone Encounter (Signed)
I would have her see an ENT doctor  ( see if they can hear what we are hearing and define it.)   Sent in the order already.

## 2011-08-11 ENCOUNTER — Ambulatory Visit: Payer: Self-pay | Admitting: Obstetrics and Gynecology

## 2012-01-13 ENCOUNTER — Encounter: Payer: Self-pay | Admitting: Family Medicine

## 2012-01-13 ENCOUNTER — Ambulatory Visit (INDEPENDENT_AMBULATORY_CARE_PROVIDER_SITE_OTHER): Payer: BC Managed Care – PPO | Admitting: Family Medicine

## 2012-01-13 VITALS — BP 124/84 | HR 117 | Temp 97.7°F | Wt 139.0 lb

## 2012-01-13 DIAGNOSIS — L709 Acne, unspecified: Secondary | ICD-10-CM

## 2012-01-13 DIAGNOSIS — L708 Other acne: Secondary | ICD-10-CM

## 2012-01-13 DIAGNOSIS — J329 Chronic sinusitis, unspecified: Secondary | ICD-10-CM

## 2012-01-13 MED ORDER — AMOXICILLIN 875 MG PO TABS: 875.0000 mg | ORAL_TABLET | Freq: Two times a day (BID) | ORAL | Status: DC

## 2012-01-13 NOTE — Progress Notes (Signed)
Chief Complaint  Patient presents with  . URI    ears popping, nasal drainage, facial pain/pressure/comgestion    HPI:  URI: -started: about 1.5 weeks ago -symptoms:nasal congestion, sore throat, cough - started as a cold, but now has worsened and has had sinus pain and pressure more on L side with increasing mucus production and yellow mucus. -denies:fever, SOB, NVD, tooth pain, strep, flu or mono exposure -has tried: ibuprofen zyrtec -sick contacts: sone with GI bug -Hx of: sinusitis  ACNE: -few pimples, usually on chin, occur more in relation to menstrual cycles -has used various tx in the past ROS: See pertinent positives and negatives per HPI.  Past Medical History  Diagnosis Date  . Positive PPD   . 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 Allergic rhinitis     Family History  Problem Relation Age of Onset  . Healthy Mother   . Hypertension Father   . Hyperlipidemia Father     History   Social History  . Marital Status: Married    Spouse Name: N/A    Number of Children: N/A  . Years of Education: N/A   Social History Main Topics  . Smoking status: Never Smoker   . Smokeless tobacco: Never Used  . Alcohol Use: Yes     Comment: OCC  . Drug Use: No  . Sexually Active: Yes   Other Topics Concern  . 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 Two children age 42 and 8HHof 4  Pet dog.    Current outpatient prescriptions:cetirizine (ZYRTEC) 10 MG tablet, Take 10 mg by mouth daily.  , Disp: , Rfl: ;  fluticasone (FLONASE) 50 MCG/ACT nasal spray, USE 2 SPRAYS IN EACH NOSTRIL EVERY DAY, Disp: 16 g, Rfl: 3;  amoxicillin (AMOXIL) 875 MG tablet, Take 1 tablet (875 mg total) by mouth 2 (two) times daily., Disp: 20 tablet, Rfl: 0  EXAM:  Filed Vitals:   01/13/12 1546  BP: 124/84  Pulse: 117  Temp: 97.7 F (36.5 C)    There is no height on file to calculate BMI.  GENERAL: vitals reviewed  and listed above, alert, oriented, appears well hydrated and in no acute distress  HEENT: atraumatic, conjunttiva clear, no obvious abnormalities on inspection of external nose and ears, normal appearance of ear canals and TMs, clear nasal congestion, mild post oropharyngeal erythema with PND, no tonsillar edema or exudate, no sinus TTP  NECK: no obvious masses on inspection  LUNGS: clear to auscultation bilaterally, no wheezes, rales or rhonchi, good air movement  CV: HRRR, no peripheral edema  MS: moves all extremities without noticeable abnormality  SKIN: few pustules on chin  PSYCH: pleasant and cooperative, no obvious depression or anxiety  ASSESSMENT AND PLAN:  Discussed the following assessment and plan:  1. Sinusitis  amoxicillin (AMOXIL) 875 MG tablet  2. Acne     -URI - possible sinusitis with facial pain and worsening after 1.5 week. Amoxicillin given (discussed risks). -discussed options for acne with recs to start with benzoyl peroxide wash -Patient advised to return or notify a doctor immediately if symptoms worsen or persist or new concerns arise.  Patient Instructions  INSTRUCTIONS FOR UPPER RESPIRATORY INFECTION:  -plenty of rest and fluids  --As we discussed, we have prescribed a new medication (AMOXICILLIN)for you at this appointment. We discussed the common and serious potential adverse effects of this medication and you can review these and more with the pharmacist  when you pick up your medication.  Please follow the instructions for use carefully and notify us immediately if you have any problems taking this medication.  -nasal saline wash 2-3 times daily (use prepackaged nasal saline or bottled/distilled water if making your own)   -can use sinex nasal spray for drainage and nasal congestion - but do NOT use longer then 3-4 days  -can use tylenol or ibuprofen as directed for aches and sorethroat  -in the winter time, using a humidifier at night is  helpful (please follow cleaning instructions)  -if you are taking a cough medication - use only as directed, may also try a teaspoon of honey to coat the throat and throat lozenges  -for sore throat, salt water gargles can help  -follow up if you have fevers, facial pain, tooth pain, difficulty breathing or are worsening or not getting better in 5-7 days      KIM, HANNAH R.

## 2012-01-13 NOTE — Patient Instructions (Addendum)
INSTRUCTIONS FOR UPPER RESPIRATORY INFECTION:  -plenty of rest and fluids  -As we discussed, we have prescribed a new medication (AMOXICILLIN) for you at this appointment. We discussed the common and serious potential adverse effects of this medication and you can review these and more with the pharmacist when you pick up your medication.  Please follow the instructions for use carefully and notify us immediately if you have any problems taking this medication.  -nasal saline wash 2-3 times daily (use prepackaged nasal saline or bottled/distilled water if making your own)   -can use sinex nasal spray for drainage and nasal congestion - but do NOT use longer then 3-4 days  -can use tylenol or ibuprofen as directed for aches and sorethroat  -in the winter time, using a humidifier at night is helpful (please follow cleaning instructions)  -if you are taking a cough medication - use only as directed, may also try a teaspoon of honey to coat the throat and throat lozenges  -for sore throat, salt water gargles can help  -follow up if you have fevers, facial pain, tooth pain, difficulty breathing or are worsening or not getting better in 5-7 days  

## 2012-04-28 ENCOUNTER — Encounter: Payer: Self-pay | Admitting: Internal Medicine

## 2012-04-28 ENCOUNTER — Ambulatory Visit (INDEPENDENT_AMBULATORY_CARE_PROVIDER_SITE_OTHER): Payer: BC Managed Care – PPO | Admitting: Internal Medicine

## 2012-04-28 VITALS — BP 138/76 | HR 91 | Temp 98.3°F | Wt 136.0 lb

## 2012-04-28 DIAGNOSIS — R0989 Other specified symptoms and signs involving the circulatory and respiratory systems: Secondary | ICD-10-CM

## 2012-04-28 DIAGNOSIS — L988 Other specified disorders of the skin and subcutaneous tissue: Secondary | ICD-10-CM

## 2012-04-28 DIAGNOSIS — R238 Other skin changes: Secondary | ICD-10-CM

## 2012-04-28 MED ORDER — CEPHALEXIN 500 MG PO CAPS: 500.0000 mg | ORAL_CAPSULE | Freq: Two times a day (BID) | ORAL | Status: DC

## 2012-04-28 NOTE — Progress Notes (Signed)
Chief Complaint  Patient presents with  . Blisters on toes    Recurrent problem also pulsatile sound in neck    HPI: Patient comes in today for problem evaluation. Onset this week, and of blisters on both second toe dorsal aspect that is sore and irritated. There is according to patient absolutely no friction change in shoes although she did wear sandals recently but nothing touch the area. She has similar problem like this last year in the blisters got very big we treated him with antibiotics empirically and went away. The previous year she states that she had had some blisters in the same area that resolved on its own.  Her dermatologist Dr. Karlyn Agee is not available until next week so she came in to the office today. These areas do not itch and she denies any trauma. Last ov was a year ago for blisters .  Update still has the high pitched pulsatile tinnitus. Had seen a vascular surgeon he did not think anything should be done because her carotid Dopplers were normal. She saw Dr. Jearld Fenton to get a good ENT exam and noted the finding. Had at noted lymph node that was biopsied and benign. Suggested observation or imaging study with MR and a or CT angiogram. At that time she decided to wait to not be too aggressive exposed to radiation etc. since that time she has continued symptoms it is worse when she lays down at night other people can hear it including her children. She denies any cardiovascular pulmonary symptoms otherwise.  ROS: See pertinent positives and negatives per HPI. No chest pain shortness of breath  Past Medical History  Diagnosis Date  . Positive PPD   . 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 Allergic rhinitis     Family History  Problem Relation Age of Onset  . Healthy Mother   . Hypertension Father   . Hyperlipidemia Father     History   Social History  . Marital Status: Married    Spouse Name: N/A    Number of Children: N/A  . Years of Education: N/A    Social History Main Topics  . Smoking status: Never Smoker   . Smokeless tobacco: Never Used  . Alcohol Use: Yes     Comment: OCC  . Drug Use: No  . Sexually Active: Yes   Other Topics Concern  . 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    Two children age 33 and 8   HHof 4  Pet dog.    Outpatient Encounter Prescriptions as of 04/28/2012  Medication Sig Dispense Refill  . fluticasone (FLONASE) 50 MCG/ACT nasal spray USE 2 SPRAYS IN EACH NOSTRIL EVERY DAY  16 g  3  . cephALEXin (KEFLEX) 500 MG capsule Take 1 capsule (500 mg total) by mouth 2 (two) times daily.  14 capsule  0  . [DISCONTINUED] amoxicillin (AMOXIL) 875 MG tablet Take 1 tablet (875 mg total) by mouth 2 (two) times daily.  20 tablet  0  . [DISCONTINUED] cetirizine (ZYRTEC) 10 MG tablet Take 10 mg by mouth daily.         No facility-administered encounter medications on file as of 04/28/2012.    EXAM:  BP 138/76  Pulse 91  Temp(Src) 98.3 F (36.8 C) (Oral)  Wt 136 lb (61.689 kg)  BMI 22.63 kg/m2  SpO2 99%  LMP 03/29/2012  Body mass index  is 22.63 kg/(m^2).  GENERAL: vitals reviewed and listed above, alert, oriented, appears well hydrated and in no acute distress  HEENT: atraumatic, conjunctiva  clear, no obvious abnormalities on inspection of external nose and ears  NECK: no obvious masses on inspection palpation on auscultation and intermittently hear a very high pitched late pulsatile liver like sound high in the neck on the left LUNGS: clear to auscultation bilaterally, no wheezes, rales or rhonchi, good air movement  CV: HRRR, no clubbing cyanosis or  peripheral edema nl cap refill  Skin of feet longer second toe left has a 8-9 mm single blister bullae  distally the right has to small blister like lesions no vesicles no streaking no trauma. MS: moves all extremities without noticeable focal  abnormality she is wearing a sandal but does  not hit in this area PSYCH: pleasant and cooperative, no obvious depression or anxiety  ASSESSMENT AND PLAN:  Discussed the following assessment and plan:  Bullous lesion - toes  firmly denies any possible trauma or friction has recurrent in this area   each spring? x 3 now. last cx neg but better with antibiotic  Vascular bruit - uncertain cause persistent - Plan: Ambulatory referral to Cardiology Can use topical antibiotic on these lesions and if getting worse add the oral antibiotics as we did last year but I am really not certain of the cause. Take pictures and still get a followup appointment with her dermatologist.  Bruit pulsatile neck; atypical high pitched late in the full cycle varies with respiration. Totally uncertain where this is coming from or potentially transmitted from :  discussed options of other evaluation ;she apparently has no symptoms with this. Although it might be progressing as  is heard more at night when she lays down .   Discussed risk benefit of specific imaging mra cta.   We'll get additional  opinion and have cardiology see her ;listen to the sound and make a suggestion about advisability of  Further procedural testing.  Vs serial observation .   -Patient advised to return or notify health care team  if symptoms worsen or persist or new concerns arise.  Patient Instructions  Again I am unsure  why you have this  Again because of no hx of  Friction.    Ok to use topica antibiotic such as poly sporin and add oral antibiotic  If needed although I am not certain it is  Infection. But it seemed to help last year.   Take picture  Of  Blisters and progression if any and fu with your dermatologist.    Will do  Cards opinion about   Neck pulse sound    Short of other imaging    Neta Mends. Alain Deschene M.D.

## 2012-04-28 NOTE — Patient Instructions (Addendum)
Again I am unsure  why you have this  Again because of no hx of  Friction.    Ok to use topica antibiotic such as poly sporin and add oral antibiotic  If needed although I am not certain it is  Infection. But it seemed to help last year.   Take picture  Of  Blisters and progression if any and fu with your dermatologist.    Will do  Cards opinion about   Neck pulse sound    Short of other imaging

## 2012-06-03 ENCOUNTER — Institutional Professional Consult (permissible substitution): Payer: BC Managed Care – PPO | Admitting: Cardiology

## 2012-06-21 ENCOUNTER — Telehealth: Payer: Self-pay | Admitting: Internal Medicine

## 2012-06-21 NOTE — Telephone Encounter (Signed)
PT called and stated that she needs to obtain a letter from her physician stating why she will be seeing a cardiologist, in order to obtain life insurance. PT stated that it was at her request to see a cardiologist, and the reflect this. Please assist.

## 2012-06-23 NOTE — Telephone Encounter (Signed)
Does she wish me to write a letter about request to get cardiology opinion  About her CV health?   And that she has no symptoms  Of vascular problem at this time?

## 2012-06-25 NOTE — Telephone Encounter (Signed)
1.) Needs to be on letterhead. 2.) Needs to explain why she was referred to cardiology.  The patient asked for the referral due to a "noise" she was hearing. 3.) Needs to say she has no cardiac hx

## 2012-06-30 NOTE — Telephone Encounter (Signed)
Patient notified to pick up at the front desk. 

## 2012-06-30 NOTE — Telephone Encounter (Signed)
Letter is written  Please make sure on appropriate letter head.

## 2012-06-30 NOTE — Telephone Encounter (Signed)
Left message for the pt to return my call.  Letter placed at the front desk for pick.  Pt needs to be notified.

## 2012-07-06 ENCOUNTER — Encounter: Payer: Self-pay | Admitting: Internal Medicine

## 2012-07-06 ENCOUNTER — Encounter: Payer: Self-pay | Admitting: Gynecology

## 2012-07-08 ENCOUNTER — Ambulatory Visit (INDEPENDENT_AMBULATORY_CARE_PROVIDER_SITE_OTHER): Payer: BC Managed Care – PPO | Admitting: Cardiology

## 2012-07-08 ENCOUNTER — Encounter: Payer: Self-pay | Admitting: Cardiology

## 2012-07-08 VITALS — BP 140/70 | HR 74 | Ht 66.0 in | Wt 134.8 lb

## 2012-07-08 DIAGNOSIS — R0989 Other specified symptoms and signs involving the circulatory and respiratory systems: Secondary | ICD-10-CM

## 2012-07-08 NOTE — Patient Instructions (Addendum)

## 2012-07-08 NOTE — Progress Notes (Signed)
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  She is the daughter of our patient Rosetta Posner.  She is being seen because of a unusual vascular sound which she has been aware of which seems to be emanating from her neck.  Auscultation by previous physicians has revealed an intermittent squeezing sound audible on both sides of her neck.  This has been present for quite some time and really has not gotten any worse.  She had carotid Dopplers on 12/30/10 which showed no evidence of any carotid artery disease.  It did show areas of increased vascularity in both lobes of her thyroid.  The patient has not been experiencing any chest pain or shortness of breath.  She is in the habit of walking 2 miles each morning for exercise without difficulty.  She had a chest x-ray 07/28/12 which was normal. Her past history has been unremarkable and she has never been hospitalized except for childbirth twice. She is married.  She has 2 sons age 50 and 48. 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  Current Outpatient Prescriptions  Medication Sig Dispense Refill  . clindamycin (CLINDAGEL) 1 % gel Apply 1 application topically as needed.       . fluticasone (FLONASE) 50 MCG/ACT nasal spray        No current facility-administered medications for this visit.    No Known Allergies  Patient Active Problem List   Diagnosis Date Noted  . Bullous lesion 04/28/2012  . 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 Carotid bruit 06/03/2011  . Blisters of multiple sites 05/05/2011  . Thyroid nodule 01/15/2011  . Visit for preventive health examination 12/10/2010  . Vascular bruit 12/10/2010  . Adult acne 12/10/2010  . Allergic rhinitis   . EUSTACHIAN TUBE DYSFUNCTION, RIGHT 10/28/2008     History  Smoking status  . Never Smoker   Smokeless tobacco  . Never Used    History  Alcohol Use  . Yes    Comment: OCC    Family History  Problem Relation Age of Onset  . Healthy Mother   . Hypertension Father   . Hyperlipidemia Father     Review of Systems: Constitutional: no fever chills diaphoresis or fatigue or change in weight.  Head and neck: no hearing loss, no epistaxis, no photophobia or visual disturbance. Respiratory: No cough, shortness of breath or wheezing. Cardiovascular: No chest pain peripheral edema, palpitations. Gastrointestinal: No abdominal distention, no abdominal pain, no change in bowel habits hematochezia or melena. Genitourinary: No dysuria, no frequency, no urgency, no nocturia. Musculoskeletal:No arthralgias, no back pain, no gait disturbance or myalgias. Neurological: No dizziness, no headaches, no numbness, no seizures, no syncope, no weakness, no tremors. Hematologic: No lymphadenopathy, no easy bruising. Psychiatric: No confusion, no hallucinations, no sleep disturbance.    Physical Exam: Filed Vitals:   07/08/12 1445  BP: 140/70  Pulse: 74   the general appearance reveals a well-developed well-nourished woman in no distress.The head and neck exam reveals pupils equal and reactive.  Extraocular movements are full.  There is no scleral icterus.  The mouth and pharynx are normal.  The neck is supple.  The carotids reveal intermittent squeaky bruits which are very high pitched.  They are more prominent  when she is vertical than when she is supine.  The timing of the squeaks appears to be correlated with respiration and tends to be maximum as she begins expiration.  The jugular venous pressure is normal.  The  thyroid is not enlarged.  There is no lymphadenopathy.  The chest is clear to percussion and auscultation.  There are no rales or rhonchi.  Expansion of the chest is symmetrical.  The precordium is quiet.  The first heart sound is  normal.  The second heart sound is physiologically split.  There is no murmur gallop rub or click.  There is no abnormal lift or heave.  The abdomen is soft and nontender.  The bowel sounds are normal.  The liver and spleen are not enlarged.  There are no abdominal masses.  There are no abdominal bruits.  Extremities reveal good pedal pulses.  There is no phlebitis or edema.  There is no cyanosis or clubbing.  Strength is normal and symmetrical in all extremities.  There is no lateralizing weakness.  There are no sensory deficits.  The skin is warm and dry.  There is no rash.  Her electrocardiogram shows normal sinus rhythm and possible left atrial enlargement and is otherwise normal.  Assessment / Plan: Vascular bruit audible over both carotids not associated with any carotid artery stenosis.  The bruits are intermittent and appeared to be correlated to phases of her respiratory cycle.  The etiology for these is not clear.  They do appear to be benign.  I doubt whether this is an unusual manifestation of mitral valve prolapse although mitral valve prolapse can sometimes cause squeaks which are audible to others as these apparently are.  We will have her return for echocardiogram to evaluate her mitral valve and her left atrial size. No medication as prescribed at this point and she should continue full activity.

## 2012-07-13 ENCOUNTER — Ambulatory Visit (HOSPITAL_COMMUNITY): Payer: BC Managed Care – PPO | Attending: Cardiology | Admitting: Radiology

## 2012-07-13 DIAGNOSIS — R0989 Other specified symptoms and signs involving the circulatory and respiratory systems: Secondary | ICD-10-CM | POA: Insufficient documentation

## 2012-07-13 NOTE — Progress Notes (Signed)
Echocardiogram performed.  

## 2012-07-15 ENCOUNTER — Telehealth: Payer: Self-pay | Admitting: *Deleted

## 2012-07-15 NOTE — Telephone Encounter (Signed)
Message copied by Burnell Blanks on Thu Jul 15, 2012  3:53 PM ------      Message from: Cassell Clement      Created: Wed Jul 14, 2012 10:12 AM       Please report.  The echocardiogram is normal.  No sign of any problem in the heart causing the intermittent squeaking noises in her neck.      I note that these noises have been heard as far back as 2012.  I don't think that this is anything serious.  I suspect that has to do with the angle at which the carotid arteries course through her neck and when she tilts her head back it exacerbates the velocity of flow through the arteries.  The prognosis is good.  No need for any further workup but if she desires further workup then an ENT consultation would probably be the next step.  No further cardiac tests needed.  Return here when necessary. ------

## 2012-07-15 NOTE — Telephone Encounter (Signed)
Advised patient. States she has actually seen ENT in the past.

## 2012-07-28 ENCOUNTER — Encounter: Payer: Self-pay | Admitting: Gynecology

## 2012-07-30 ENCOUNTER — Encounter: Payer: Self-pay | Admitting: Gynecology

## 2012-07-30 ENCOUNTER — Telehealth: Payer: Self-pay | Admitting: Cardiology

## 2012-07-30 ENCOUNTER — Ambulatory Visit (INDEPENDENT_AMBULATORY_CARE_PROVIDER_SITE_OTHER): Payer: BC Managed Care – PPO | Admitting: Gynecology

## 2012-07-30 ENCOUNTER — Encounter: Payer: Self-pay | Admitting: *Deleted

## 2012-07-30 VITALS — BP 120/80 | Ht 66.25 in | Wt 128.0 lb

## 2012-07-30 DIAGNOSIS — Z01419 Encounter for gynecological examination (general) (routine) without abnormal findings: Secondary | ICD-10-CM

## 2012-07-30 NOTE — Telephone Encounter (Signed)
Letter done, advised patient

## 2012-07-30 NOTE — Progress Notes (Signed)
Cheryl Petersen 24-Oct-1965 [id number]   History:    47 y.o.  for annual gyn exam with no complaints today. Patient is having normal menstrual cycle. Husband had vasectomy. Patient weighed 135 last year down to 128 pounds. Mammogram July this year normal with the exception of dense breast. Patient did have a 3-D mammogram. Patient would know prior history of abnormal Pap smear. Patient's primary physician is Dr. Fabian Sharp.  Past medical history,surgical history, family history and social history were all reviewed and documented in the EPIC chart.  Gynecologic History Patient's last menstrual period was 07/17/2012. Contraception: vasectomy Last Pap: 2013. Results were: normal Last mammogram: 2014. Results were: normal  Obstetric History OB History   Grav Para Term Preterm Abortions TAB SAB Ect Mult Living   3 2 2  1  1   2      # Outc Date GA Lbr Len/2nd Wgt Sex Del Anes PTL Lv   1 TRM     M SVD  No Yes   2 TRM     M CS  No Yes   3 SAB                ROS: A ROS was performed and pertinent positives and negatives are included in the history.  GENERAL: No fevers or chills. HEENT: No change in vision, no earache, sore throat or sinus congestion. NECK: No pain or stiffness. CARDIOVASCULAR: No chest pain or pressure. No palpitations. PULMONARY: No shortness of breath, cough or wheeze. GASTROINTESTINAL: No abdominal pain, nausea, vomiting or diarrhea, melena or bright red blood per rectum. GENITOURINARY: No urinary frequency, urgency, hesitancy or dysuria. MUSCULOSKELETAL: No joint or muscle pain, no back pain, no recent trauma. DERMATOLOGIC: No rash, no itching, no lesions. ENDOCRINE: No polyuria, polydipsia, no heat or cold intolerance. No recent change in weight. HEMATOLOGICAL: No anemia or easy bruising or bleeding. NEUROLOGIC: No headache, seizures, numbness, tingling or weakness. PSYCHIATRIC: No depression, no loss of interest in normal activity or change in sleep pattern.     Exam: chaperone  present  BP 120/80  Ht 5' 6.25" (1.683 m)  Wt 128 lb (58.06 kg)  BMI 20.5 kg/m2  LMP 07/17/2012  Body mass index is 20.5 kg/(m^2).  General appearance : Well developed well nourished female. No acute distress HEENT: Neck supple, trachea midline, no carotid bruits, no thyroidmegaly Lungs: Clear to auscultation, no rhonchi or wheezes, or rib retractions  Heart: Regular rate and rhythm, no murmurs or gallops Breast:Examined in sitting and supine position were symmetrical in appearance, no palpable masses or tenderness,  no skin retraction, no nipple inversion, no nipple discharge, no skin discoloration, no axillary or supraclavicular lymphadenopathy Abdomen: no palpable masses or tenderness, no rebound or guarding Extremities: no edema or skin discoloration or tenderness  Pelvic:  Bartholin, Urethra, Skene Glands: Within normal limits             Vagina: No gross lesions or discharge  Cervix: No gross lesions or discharge  Uterus  anteverted, normal size, shape and consistency, non-tender and mobile  Adnexa  Without masses or tenderness  Anus and perineum  normal   Rectovaginal  normal sphincter tone without palpated masses or tenderness             Hemoccult none indicated     Assessment/Plan:  47 y.o. female for annual exam with no complaints today. Normal exam. Pap smear not done today. The new guidelines were discussed. The patient was reminded to do her monthly breast  exam. We discussed importance of calcium vitamin D and regular exercise for osteoporosis prevention. She will make the appointment to see her primary physician in a fasting state so that she will do her lab work then. Literature and information on the Tdap vaccine was provided.    Ok Edwards MD, 12:31 PM 07/30/2012

## 2012-07-30 NOTE — Patient Instructions (Addendum)

## 2012-07-30 NOTE — Telephone Encounter (Signed)
New problem   Pt needs a note stating that echocardiogram was fine for insurance purpose

## 2012-08-04 ENCOUNTER — Telehealth: Payer: Self-pay | Admitting: Cardiology

## 2012-08-04 NOTE — Telephone Encounter (Signed)
New Prob      Pts husband states some information needs to be added to life Chief Executive Officer. Please call,.

## 2012-08-04 NOTE — Telephone Encounter (Signed)
Needs letter stating she was negative for pulsatile tinnitus secondary to her referring doctor suggesting she might have this. Will forward to  Dr. Patty Sermons for review

## 2012-08-05 ENCOUNTER — Encounter: Payer: Self-pay | Admitting: Cardiology

## 2012-08-05 NOTE — Telephone Encounter (Signed)
Letter dictated

## 2012-08-05 NOTE — Telephone Encounter (Signed)
Advised husband [phone] fax and mail

## 2012-08-16 ENCOUNTER — Telehealth: Payer: Self-pay | Admitting: Cardiology

## 2012-08-16 ENCOUNTER — Telehealth: Payer: Self-pay | Admitting: Internal Medicine

## 2012-08-16 NOTE — Telephone Encounter (Signed)
Spoke with patient and her insurance is wanting for her to have a MRI of the brain. Advised patient the ordering MD should be her PCP. Patient stated she would call them

## 2012-08-16 NOTE — Telephone Encounter (Signed)
Pt states she was referred to cardiologist for a echocardiogram. But life insurance company has now requested pt have a MRI in order for them to cover her.  pls advise.

## 2012-08-16 NOTE — Telephone Encounter (Signed)
New Prob     Pt has some questions regarding life insurance. Pt states they want her to have an MRI done. Please call.

## 2012-08-17 NOTE — Telephone Encounter (Signed)
I spoke to the pt.  Her life insurance company wants her to have an MRI for the noise she hears.  They believe there may be an abnormality in her brain.  Please advise.

## 2012-08-19 NOTE — Telephone Encounter (Signed)
Needs OV with any correspondence that she has received telling her to get an MRI. Then we can advise her best. alternateively  we can get her back with a specialist who can affirm a clean bill of health.

## 2012-08-20 NOTE — Telephone Encounter (Signed)
Done kh

## 2012-08-24 ENCOUNTER — Ambulatory Visit (INDEPENDENT_AMBULATORY_CARE_PROVIDER_SITE_OTHER): Payer: BC Managed Care – PPO | Admitting: Internal Medicine

## 2012-08-24 ENCOUNTER — Encounter: Payer: Self-pay | Admitting: Internal Medicine

## 2012-08-24 VITALS — BP 110/76 | HR 70 | Temp 98.1°F | Wt 135.0 lb

## 2012-08-24 DIAGNOSIS — R0989 Other specified symptoms and signs involving the circulatory and respiratory systems: Secondary | ICD-10-CM

## 2012-08-24 NOTE — Progress Notes (Signed)
Chief Complaint  Patient presents with  . Follow-up    HPI: Patient comes in today feeling well however having difficulties obtaining adequate life insurance related to business alone because of the diagnosis of "pulsatile bruit" in her record. Review she has had an on and occurring found related to respirations in her lower left neck off and on for a while without associated neurologic or vascular symptoms. She underwent ENT evaluation Dopplers of the neck evaluation by Dr. early vascular surgeon and cardiologist who performed an echocardiogram that was totally normal.  Vascular and cardiology consult advised no further followup needed or other studies as she was well and not felt to have significant disease.  [employer]  insurance however send a note to her despite this that she needed an MRI or CT scan of her head to make sure she didn't have a brain problem.  No new sx.  she is not anymore particularly worried about it and has gotten used to it sometimes positional not heard all the time is not really in her head or earAnd sometimes    worse at night.  No numbness weakness syncope.  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 ROS: See pertinent positives and negatives per HPI.  Past Medical History  Diagnosis Date  . Positive PPD   . 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 Allergic rhinitis     Family History  Problem Relation Age of Onset  . Healthy Mother   . Hypertension Father   . Hyperlipidemia Father   . Cancer Maternal Grandmother     skin  . Cancer Maternal Grandfather     skin    History   Social History  . Marital Status: Married    Spouse Name: N/A    Number of Children: N/A  . Years of Education: N/A   Social History Main Topics  . Smoking status: Never Smoker   . Smokeless tobacco: Never  Used  . Alcohol Use: Yes     Comment: OCC  . Drug Use: No  . Sexual Activity: Yes   Other Topics Concern  . 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    Two children age 45 and 8   HHof 4  Pet dog.    Outpatient Encounter Prescriptions as of 08/24/2012  Medication Sig Dispense Refill  . clindamycin (CLINDAGEL) 1 % gel Apply 1 application topically as needed.       . fluticasone (FLONASE) 50 MCG/ACT nasal spray        No facility-administered encounter medications on file as of 08/24/2012.    EXAM:  BP 110/76  Pulse 70  Temp(Src) 98.1 F (36.7 C) (Oral)  Wt 135 lb (61.236 kg)  BMI 21.62 kg/m2  SpO2 98%  LMP 07/17/2012  Body mass index is 21.62 kg/(m^2).  GENERAL: vitals reviewed and listed above, alert, oriented, appears well hydrated and in no acute distress  HEENT: atraumatic, conjunctiva  clear, no obvious abnormalities on inspection of external nose and ears OP : no lesion edema or exudate  NECK: no obvious masses on inspection palpation I do not hear a sound today send previously in the left lower neck LUNGS: clear to auscultation bilaterally, no wheezes, rales or rhonchi, good air movement  CV: HRRR, no clubbing cyanosis or  peripheral edema nl cap refill  MS: moves all extremities without noticeable focal  abnormality Neurologic cranial nerves III through XII appear intact nonfocal exam grossly. PSYCH: pleasant and cooperative, no obvious depression or anxiety  ASSESSMENT AND PLAN:  Discussed the following assessment and plan:  vascular sound assoc with respiration - Uncertain cause but mostly benign not felt to be vascular disease cardiac disease as per specialist. Insurance company requests is problematic it is not seem to I would suspect an MRI or CT of the brain wouldn't be helpful because of location of the sound and no neuro sx. And the fact that  Vascular didn't feel need for evaluation.    However because of the implications would have consult with neurology to see their opinion about clinical advisability of any further imaging or testing such as vascular imaging although not advised by vascular surgery.  Also any opinion on disease possibility that would affect lifespan. And thus eligibility for life insurance.  -Patient advised to return or notify health care team  if symptoms worsen or persist or new concerns arise.  Patient Instructions  I think you are healthy. dont think mri and ct o brain is indicated but we can     Get neurology  Input also.   Neta Mends. Madai Nuccio M.D.

## 2012-08-24 NOTE — Patient Instructions (Signed)
I think you are healthy. dont think mri and ct o brain is indicated but we can     Get neurology  Input also.

## 2012-10-04 ENCOUNTER — Other Ambulatory Visit: Payer: Self-pay | Admitting: Endocrinology

## 2012-10-04 DIAGNOSIS — E049 Nontoxic goiter, unspecified: Secondary | ICD-10-CM

## 2012-10-18 ENCOUNTER — Ambulatory Visit: Payer: BC Managed Care – PPO | Admitting: Neurology

## 2012-11-09 ENCOUNTER — Encounter: Payer: Self-pay | Admitting: Neurology

## 2012-11-09 ENCOUNTER — Ambulatory Visit (INDEPENDENT_AMBULATORY_CARE_PROVIDER_SITE_OTHER): Payer: BC Managed Care – PPO | Admitting: Neurology

## 2012-11-09 VITALS — BP 98/68 | HR 64 | Temp 98.0°F | Ht 66.0 in | Wt 135.0 lb

## 2012-11-09 DIAGNOSIS — R0989 Other specified symptoms and signs involving the circulatory and respiratory systems: Secondary | ICD-10-CM

## 2012-11-09 NOTE — Patient Instructions (Signed)
I don't appreciate the sound on today's visit.  It does not seem like carotid bruits and it does not correlate with pulsatile tinnitus.  You already had carotid doppler of neck, which did not reveal any blockage.  The next step would be MRI of brain and MRA, but that is usually performed looking for causes of pulsatile tinnitus, which is not what this seems like.

## 2012-11-09 NOTE — Progress Notes (Signed)
NEUROLOGY CONSULTATION NOTE  Cheryl Petersen MRN: [id number] DOB: 1965/03/26  Referring provider: Dr. Fabian Sharp Primary care provider: Dr. Fabian Sharp  Reason for consult:  Abnormal vascular sound from neck.  HISTORY OF PRESENT ILLNESS: Cheryl Petersen is a 47 year old right-handed woman with history of ruptured TM and positive PPD who presents for pulsatile bruit in the neck. .  Records and images were personally reviewed where available.    She first noticed it about 3 years ago.  She bent her head back to put on makeup and noticed a "heart beat" sound coming from her neck.  It is unchanged over the past 3 years.  It is not a "whooshing" sound and she does not hear it in her head or ears.  It was not only audible by her, but also by others.  It is intermittent and occurs spontaneously, but it appears to be more prominent when bending her head back or laying down in a quiet place.  One of her physicians noted that it correlated to phases of her respiratory cycle.  She denies visual disturbance, headache, ear fullness, lightheadedness, vertigo or facial numbness.  She has been evaluated by vascular surgery, cardiology and ENT.  She was found to have a thyroid nodule.  Thyroid tests were normal and biopsy was negative.  Carotid doppler performed on 12/30/10 was normal but did reveal areas of increased vascularity in both lobes of her thyroid.  2D echo performed on 07/15/12 was normal with LVEF 55-60%.  EKG has revealed normal sinus rhythm.  Fiberoptic laryngoscopy was performed by ENT and was unremarkable.  It has been appreciated by some of her physicians, but the vascular specialist did not hear it and did not recommend further workup.  It was suggested by ENT to have an MRI and MRA performed, but she declined because she didn't want to perform unnecessary tests.  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  PAST MEDICAL HISTORY: Past Medical History  Diagnosis Date  . Positive PPD   . 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 Allergic rhinitis     PAST SURGICAL HISTORY: Past Surgical History  Procedure Laterality Date  . No past surgeries    . Endometrial ablation      2011  . Cesarean section      MEDICATIONS: Current Outpatient Prescriptions on File Prior to Visit  Medication Sig Dispense Refill  . clindamycin (CLINDAGEL) 1 % gel Apply 1 application topically as needed.       . fluticasone (FLONASE) 50 MCG/ACT nasal spray        No current facility-administered medications on file prior to visit.    ALLERGIES: No Known Allergies  FAMILY HISTORY: Family History  Problem Relation Age of Onset  . Healthy Mother   . Hypertension Father   . Hyperlipidemia Father   . Cancer Maternal Grandmother     skin  . Cancer Maternal Grandfather     skin    SOCIAL HISTORY: History   Social History  . Marital Status: Married    Spouse Name: N/A    Number of Children: N/A  . Years of Education: N/A   Occupational History  . Not on file.   Social History Main Topics  . Smoking status: Never  Smoker   . Smokeless tobacco: Never Used  . Alcohol Use: Yes     Comment: OCC  . Drug Use: No  . Sexual Activity: Yes   Other Topics Concern  . 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    REVIEW OF SYSTEMS: Constitutional: No fevers, chills, or sweats, no generalized fatigue, change in appetite Eyes: No visual changes, double vision, eye pain Ear, nose and throat: No hearing loss, ear pain, nasal congestion, sore throat Cardiovascular: No chest pain, palpitations Respiratory:  No shortness of breath at rest or with exertion,  wheezes GastrointestinaI: No nausea, vomiting, diarrhea, abdominal pain, fecal incontinence Genitourinary:  No dysuria, urinary retention or frequency Musculoskeletal:  No neck pain, back pain Integumentary: No rash, pruritus, skin lesions Neurological: as above Psychiatric: No depression, insomnia, anxiety Endocrine: No palpitations, fatigue, diaphoresis, mood swings, change in appetite, change in weight, increased thirst Hematologic/Lymphatic:  No anemia, purpura, petechiae. Allergic/Immunologic: no itchy/runny eyes, nasal congestion, recent allergic reactions, rashes  PHYSICAL EXAM: Filed Vitals:   11/09/12 0919  BP: 98/68  Pulse: 64  Temp: 98 F (36.7 C)   General: No acute distress Head:  Normocephalic/atraumatic Neck: supple, no paraspinal tenderness, full range of motion Back: No paraspinal tenderness Heart: regular rate and rhythm Lungs: Clear to auscultation bilaterally. Vascular: No carotid bruits. Neurological Exam: Mental status: alert and oriented to person, place, and time, speech fluent and not dysarthric, language intact. Cranial nerves: CN I: not tested CN II: pupils equal, round and reactive to light, visual fields intact, fundi unremarkable. CN III, IV, VI:  full range of motion, no nystagmus, no ptosis CN V: facial sensation intact CN VII: upper and lower face symmetric CN VIII: hearing intact CN IX, X: gag intact, uvula midline CN XI: sternocleidomastoid and trapezius muscles intact CN XII: tongue midline Bulk & Tone: normal, no fasciculations. Motor: 5/5 throughout Sensation: temperature and vibration intact Deep Tendon Reflexes: 2+ throughout Finger to nose testing: normal Gait: normal stance and stride.  Able to walk on toes, heels and in tandem. Romberg negative.  IMPRESSION: Abnormal vascular sound.  It is subjective, but also objective finding to others.  I am unable to appreciate it on this exam and we tried to trigger it with change in  position.  She reports it is not present at this time.  However, it does not sound like carotid bruits and it does not correlate with pulsatile tinnitus.  Since this has been present for 3 years, it is probably benign and unlikely to be serious.  PLAN: From a neurological standpoint, further testing would include MRI of brain and MRA of head and neck to look for dural arteriovenous fistula, arteriovenous malformation, aneurysm or skull base tumor.  However, this is usually workup for causes of pulsatile tinnitus, which this doesn't appear to be.  At this point, she does not wish to pursue imaging.  I suggest this testing only for sake of completeness, but I don't feel it is absolutely necessary given that her symptoms do not sound like pulsatile tinnitus.  45 minutes spent with patient, over 50% spent counseling and coordinating care.  Thank you for allowing me to take part in the care of this patient.  Shon Millet, DO  CC:  Berniece Andreas, MD

## 2012-11-11 ENCOUNTER — Other Ambulatory Visit: Payer: Self-pay

## 2013-01-01 ENCOUNTER — Other Ambulatory Visit: Payer: Self-pay | Admitting: Internal Medicine

## 2013-01-03 NOTE — Telephone Encounter (Signed)
Patient has not had a physical.  Please advise.  Thanks!

## 2013-01-04 ENCOUNTER — Encounter: Payer: Self-pay | Admitting: Nurse Practitioner

## 2013-01-04 ENCOUNTER — Ambulatory Visit (INDEPENDENT_AMBULATORY_CARE_PROVIDER_SITE_OTHER): Payer: BC Managed Care – PPO | Admitting: Nurse Practitioner

## 2013-01-04 VITALS — BP 112/70 | HR 78 | Temp 98.7°F | Ht 66.0 in | Wt 136.5 lb

## 2013-01-04 DIAGNOSIS — R059 Cough, unspecified: Secondary | ICD-10-CM

## 2013-01-04 DIAGNOSIS — B9789 Other viral agents as the cause of diseases classified elsewhere: Secondary | ICD-10-CM

## 2013-01-04 DIAGNOSIS — R05 Cough: Secondary | ICD-10-CM

## 2013-01-04 DIAGNOSIS — Z23 Encounter for immunization: Secondary | ICD-10-CM

## 2013-01-04 MED ORDER — AZITHROMYCIN 250 MG PO TABS: ORAL_TABLET | ORAL | Status: DC

## 2013-01-04 MED ORDER — BENZONATATE 100 MG PO CAPS: ORAL_CAPSULE | ORAL | Status: DC

## 2013-01-04 NOTE — Progress Notes (Signed)
   Subjective:    Patient ID: Cheryl Petersen, female    DOB: 05-05-1965, 47 y.o.   MRN: [id number]  Cough This is a chronic problem. The current episode started 1 to 4 weeks ago (2.5 weeks). The problem has been waxing and waning (fever resolved, cough persistent, ear stuffiness started few days ago.). The problem occurs hourly. The cough is productive of sputum. Associated symptoms include ear congestion, nasal congestion and postnasal drip. Pertinent negatives include no chest pain (sternal soreness with coughing), chills, ear pain (c/o fullness R ear), fever, headaches, sore throat, shortness of breath or wheezing. Nothing aggravates the symptoms. Treatments tried: cough drops.      Review of Systems  Constitutional: Negative for fever, chills, activity change, appetite change and fatigue.  HENT: Positive for congestion and postnasal drip. Negative for ear pain (c/o fullness R ear) and sore throat.   Respiratory: Positive for cough. Negative for chest tightness, shortness of breath and wheezing.   Cardiovascular: Negative for chest pain (sternal soreness with coughing).  Gastrointestinal: Negative for abdominal pain.  Musculoskeletal: Negative for back pain.  Neurological: Negative for headaches.  Hematological: Negative for adenopathy.       Objective:   Physical Exam  Vitals reviewed. Constitutional: She is oriented to person, place, and time. She appears well-developed and well-nourished. No distress.  HENT:  Head: Normocephalic and atraumatic.  Right Ear: External ear normal.  Left Ear: External ear normal.  Mouth/Throat: Oropharynx is clear and moist. No oropharyngeal exudate.  Purulent nasal discharge  Eyes: Conjunctivae are normal. Right eye exhibits no discharge. Left eye exhibits no discharge.  Neck: Normal range of motion. Neck supple. No thyromegaly present.  Cardiovascular: Normal rate and normal heart sounds.   No murmur heard. Pulmonary/Chest: Effort normal and breath  sounds normal. No respiratory distress. She has no wheezes.  Lymphadenopathy:    She has no cervical adenopathy.  Neurological: She is alert and oriented to person, place, and time.  Skin: Skin is warm and dry.  Psychiatric: She has a normal mood and affect. Her behavior is normal. Thought content normal.          Assessment & Plan:  1. Viral respiratory illness w/cough Duration 2.5 weeks, ear fullness - benzonatate (TESSALON) 100 MG capsule; Take 1-2 capsules po up to 3 times daily PRN cough  Dispense: 60 capsule; Refill: 0 - azithromycin (ZITHROMAX) 250 MG tablet; Take 2T po on day 1, then 1T po qd on days 2-5.  Dispense: 6 tablet; Refill: 0  See pt instructions.

## 2013-01-04 NOTE — Telephone Encounter (Signed)
Ok to refill for 1 year for flonase ( has seen gyne for pv in last year)

## 2013-01-04 NOTE — Progress Notes (Signed)
Pre-visit discussion using our clinic review tool. No additional management support is needed unless otherwise documented below in the visit note.  

## 2013-01-04 NOTE — Patient Instructions (Signed)
You have had a virus that has caused your symptoms. The average duration of viral respiratory symptoms is 14 days, but you may cough for 3-4 weeks. Start daily sinus rinses (neilmed Sinus Rinse). Continue to use flonase daily for at least 10 days. Also you may use 30 mg pseudoephedrine twice daily to further help with ear stuffiness. Use benzonatate capsules for cough. Also you may use mucinex to help loosen congestion. Fill antibiotic if no improvement within a few days. Sip fluids every hour. Rest. If you are not feeling better in 1 week or develop fever or chest pain, call us for re-evaluation. Feel better!  Acute Bronchitis Bronchitis is inflammation of the airways that extend from the windpipe into the lungs (bronchi). The inflammation often causes mucus to develop. This leads to a cough, which is the most common symptom of bronchitis.  In acute bronchitis, the condition usually develops suddenly and goes away over time, usually in a couple weeks. Smoking, allergies, and asthma can make bronchitis worse. Repeated episodes of bronchitis may cause further lung problems.  CAUSES Acute bronchitis is most often caused by the same virus that causes a cold. The virus can spread from person to person (contagious).  SIGNS AND SYMPTOMS   Cough.   Fever.   Coughing up mucus.   Body aches.   Chest congestion.   Chills.   Shortness of breath.   Sore throat.  DIAGNOSIS  Acute bronchitis is usually diagnosed through a physical exam. Tests, such as chest X-rays, are sometimes done to rule out other conditions.  TREATMENT  Acute bronchitis usually goes away in a couple weeks. Often times, no medical treatment is necessary. Medicines are sometimes given for relief of fever or cough. Antibiotics are usually not needed but may be prescribed in certain situations. In some cases, an inhaler may be recommended to help reduce shortness of breath and control the cough. A cool mist vaporizer may also  be used to help thin bronchial secretions and make it easier to clear the chest.  HOME CARE INSTRUCTIONS  Get plenty of rest.   Drink enough fluids to keep your urine clear or pale yellow (unless you have a medical condition that requires fluid restriction). Increasing fluids may help thin your secretions and will prevent dehydration.   Only take over-the-counter or prescription medicines as directed by your health care provider.   Avoid smoking and secondhand smoke. Exposure to cigarette smoke or irritating chemicals will make bronchitis worse. If you are a smoker, consider using nicotine gum or skin patches to help control withdrawal symptoms. Quitting smoking will help your lungs heal faster.   Reduce the chances of another bout of acute bronchitis by washing your hands frequently, avoiding people with cold symptoms, and trying not to touch your hands to your mouth, nose, or eyes.   Follow up with your health care provider as directed.  SEEK MEDICAL CARE IF: Your symptoms do not improve after 1 week of treatment.  SEEK IMMEDIATE MEDICAL CARE IF:  You develop an increased fever or chills.   You have chest pain.   You have severe shortness of breath.  You have bloody sputum.   You develop dehydration.  You develop fainting.  You develop repeated vomiting.  You develop a severe headache. MAKE SURE YOU:   Understand these instructions.  Will watch your condition.  Will get help right away if you are not doing well or get worse. Document Released: 01/31/2004 Document Revised: 08/25/2012 Document Reviewed: 06/15/2012 ExitCare  Patient Information 2014 [place], [place].

## 2013-01-04 NOTE — Addendum Note (Signed)
Addended by: Marlene Lard on: 01/04/2013 08:46 AM   Modules accepted: Orders

## 2013-05-31 ENCOUNTER — Ambulatory Visit (INDEPENDENT_AMBULATORY_CARE_PROVIDER_SITE_OTHER): Payer: BC Managed Care – PPO | Admitting: Family Medicine

## 2013-05-31 ENCOUNTER — Encounter: Payer: Self-pay | Admitting: Family Medicine

## 2013-05-31 VITALS — BP 102/80 | HR 77 | Temp 98.5°F | Ht 66.0 in | Wt 140.0 lb

## 2013-05-31 DIAGNOSIS — J329 Chronic sinusitis, unspecified: Secondary | ICD-10-CM

## 2013-05-31 DIAGNOSIS — J310 Chronic rhinitis: Secondary | ICD-10-CM

## 2013-05-31 MED ORDER — AZITHROMYCIN 250 MG PO TABS: ORAL_TABLET | ORAL | Status: DC

## 2013-05-31 NOTE — Progress Notes (Signed)
Pre visit review using our clinic review tool, if applicable. No additional management support is needed unless otherwise documented below in the visit note. 

## 2013-05-31 NOTE — Progress Notes (Signed)
No chief complaint on file.   HPI:  -started: 2 weeks ago -symptoms:nasal congestion, PND, cough, hoarseness from time to time, sinus pressure, green mucus -denies:fever, SOB, NVD, tooth pain -has tried: zyrtec and flonase -sick contacts/travel/risks: denies flu exposure, tick exposure or or Ebola risks -Hx of: allergies ROS: See pertinent positives and negatives per HPI.  Past Medical History  Diagnosis Date  . Positive PPD   . 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 Allergic rhinitis     Past Surgical History  Procedure Laterality Date  . No past surgeries    . Endometrial ablation      2011  . Cesarean section      Family History  Problem Relation Age of Onset  . Healthy Mother   . Hypertension Father   . Hyperlipidemia Father   . Cancer Maternal Grandmother     skin  . Cancer Maternal Grandfather     skin    History   Social History  . Marital Status: Married    Spouse Name: N/A    Number of Children: N/A  . Years of Education: N/A   Social History Main Topics  . Smoking status: Never Smoker   . Smokeless tobacco: Never Used  . Alcohol Use: Yes     Comment: OCC  . Drug Use: No  . Sexual Activity: Yes   Other Topics Concern  . 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    Two children age 89 and 8   HHof 4  Pet dog.    Current outpatient prescriptions:clindamycin (CLINDAGEL) 1 % gel, Apply 1 application topically as needed. , Disp: , Rfl: ;  fluticasone (FLONASE) 50 MCG/ACT nasal spray, USE 2 SPRAYS IN EACH NOSTRIL EVERY DAY, Disp: 16 g, Rfl: 11;  azithromycin (ZITHROMAX) 250 MG tablet, 2 tabs on day one then 1 tab daily, Disp: 6 tablet, Rfl: 0  EXAM:  Filed Vitals:   05/31/13 1550  BP: 102/80  Pulse: 77  Temp: 98.5 F (36.9 C)    Body mass index is 22.61 kg/(m^2).  GENERAL: vitals reviewed and listed above, alert, oriented, appears well hydrated and in no  acute distress  HEENT: atraumatic, conjunttiva clear, no obvious abnormalities on inspection of external nose and ears, normal appearance of ear canals and TMs, clear nasal congestion, mild post oropharyngeal erythema with PND, no tonsillar edema or exudate, no sinus TTP  NECK: no obvious masses on inspection  LUNGS: clear to auscultation bilaterally, no wheezes, rales or rhonchi, good air movement  CV: HRRR, no peripheral edema  MS: moves all extremities without noticeable abnormality  PSYCH: pleasant and cooperative, no obvious depression or anxiety  ASSESSMENT AND PLAN:  Discussed the following assessment and plan:  Rhinosinusitis - Plan: azithromycin (ZITHROMAX) 250 MG tablet  -given HPI and exam findings today, a serious infection or illness is unlikely. We discussed potential etiologies, with VURI being most likely, and advised supportive care and monitoring. We discussed treatment side effects, likely course, antibiotic misuse, transmission, and signs of developing a serious illness. -however, given length of illness and she is not getting better advise nasal decongestant - but if not improving or worsening she opted for abx after discussion risks/benefits in care bacterial sinusitis - she wanted to do azithromycin -of course, we advised to return or notify a doctor immediately if symptoms worsen or persist or new concerns arise.    There  are no Patient Instructions on file for this visit.   Terressa KoyanagiHannah R. Kim

## 2013-06-21 ENCOUNTER — Other Ambulatory Visit: Payer: BC Managed Care – PPO

## 2013-06-23 ENCOUNTER — Other Ambulatory Visit: Payer: BC Managed Care – PPO

## 2013-06-30 ENCOUNTER — Ambulatory Visit
Admission: RE | Admit: 2013-06-30 | Discharge: 2013-06-30 | Disposition: A | Discharge status: Home / Self Care | Payer: BC Managed Care – PPO | Source: Ambulatory Visit | Attending: Endocrinology | Admitting: Endocrinology

## 2013-06-30 DIAGNOSIS — E049 Nontoxic goiter, unspecified: Secondary | ICD-10-CM

## 2013-07-11 ENCOUNTER — Telehealth: Payer: Self-pay

## 2013-07-11 DIAGNOSIS — Z01419 Encounter for gynecological examination (general) (routine) without abnormal findings: Secondary | ICD-10-CM

## 2013-07-11 NOTE — Telephone Encounter (Signed)
This would be fine. CBC, comprehensive metabolic panel, TSH, fasting lipid profile, and urinalysis

## 2013-07-11 NOTE — Telephone Encounter (Signed)
Patient has her CE scheduled with you on 08/01/13.  She would like to come fasting one morning prior to visit to have her labwork done.  Please advise if okay, regarding lab orders.

## 2013-07-12 NOTE — Telephone Encounter (Signed)
Patient informed fine to come by ahead of yearly exam for labs. Come well hydrated nothing to eat or drink after midnight except water. Call for lab appt.  Orders put in

## 2013-07-13 ENCOUNTER — Other Ambulatory Visit: Payer: BC Managed Care – PPO

## 2013-07-13 DIAGNOSIS — Z01419 Encounter for gynecological examination (general) (routine) without abnormal findings: Secondary | ICD-10-CM

## 2013-07-13 LAB — LIPID PANEL
Cholesterol: 183 mg/dL (ref 0–200)
HDL: 61 mg/dL (ref >39)
LDL CALC: 110 mg/dL — AB (ref 0–99)
Total CHOL/HDL Ratio: 3 Ratio
Triglycerides: 58 mg/dL (ref <150)
VLDL: 12 mg/dL (ref 0–40)

## 2013-07-13 LAB — CBC WITH DIFFERENTIAL/PLATELET
Basophils Absolute: 0 10*3/uL (ref 0.0–0.1)
Basophils Relative: 1 % (ref 0–1)
Eosinophils Absolute: 0.1 10*3/uL (ref 0.0–0.7)
Eosinophils Relative: 2 % (ref 0–5)
HCT: 40.9 % (ref 36.0–46.0)
Hemoglobin: 14.2 g/dL (ref 12.0–15.0)
LYMPHS PCT: 26 % (ref 12–46)
Lymphs Abs: 1.1 10*3/uL (ref 0.7–4.0)
MCH: 30.1 pg (ref 26.0–34.0)
MCHC: 34.7 g/dL (ref 30.0–36.0)
MCV: 86.8 fL (ref 78.0–100.0)
MONO ABS: 0.4 10*3/uL (ref 0.1–1.0)
Monocytes Relative: 8 % (ref 3–12)
Neutro Abs: 2.8 10*3/uL (ref 1.7–7.7)
Neutrophils Relative %: 63 % (ref 43–77)
Platelets: 309 10*3/uL (ref 150–400)
RBC: 4.71 MIL/uL (ref 3.87–5.11)
RDW: 12.5 % (ref 11.5–15.5)
WBC: 4.4 10*3/uL (ref 4.0–10.5)

## 2013-07-13 LAB — TSH: TSH: 1.507 u[IU]/mL (ref 0.350–4.500)

## 2013-07-13 LAB — COMPREHENSIVE METABOLIC PANEL
ALBUMIN: 4.2 g/dL (ref 3.5–5.2)
ALT: 12 U/L (ref 0–35)
AST: 17 U/L (ref 0–37)
Alkaline Phosphatase: 46 U/L (ref 39–117)
BUN: 12 mg/dL (ref 6–23)
CALCIUM: 8.9 mg/dL (ref 8.4–10.5)
CHLORIDE: 104 meq/L (ref 96–112)
CO2: 26 mEq/L (ref 19–32)
Creat: 0.87 mg/dL (ref 0.50–1.10)
Glucose, Bld: 89 mg/dL (ref 70–99)
POTASSIUM: 4.2 meq/L (ref 3.5–5.3)
Sodium: 138 mEq/L (ref 135–145)
Total Bilirubin: 0.6 mg/dL (ref 0.2–1.2)
Total Protein: 6.7 g/dL (ref 6.0–8.3)

## 2013-07-14 ENCOUNTER — Other Ambulatory Visit: Payer: Self-pay | Admitting: Gynecology

## 2013-07-14 DIAGNOSIS — R3129 Other microscopic hematuria: Secondary | ICD-10-CM

## 2013-07-14 LAB — URINALYSIS W MICROSCOPIC + REFLEX CULTURE
Bacteria, UA: NONE SEEN
Bilirubin Urine: NEGATIVE
Casts: NONE SEEN
Crystals: NONE SEEN
Glucose, UA: NEGATIVE mg/dL
Ketones, ur: NEGATIVE mg/dL
LEUKOCYTES UA: NEGATIVE
Nitrite: NEGATIVE
Protein, ur: NEGATIVE mg/dL
Specific Gravity, Urine: 1.013 (ref 1.005–1.030)
UROBILINOGEN UA: 0.2 mg/dL (ref 0.0–1.0)
pH: 6 (ref 5.0–8.0)

## 2013-07-15 ENCOUNTER — Encounter: Payer: Self-pay | Admitting: Gynecology

## 2013-08-01 ENCOUNTER — Ambulatory Visit (INDEPENDENT_AMBULATORY_CARE_PROVIDER_SITE_OTHER): Payer: BC Managed Care – PPO | Admitting: Gynecology

## 2013-08-01 ENCOUNTER — Encounter: Payer: Self-pay | Admitting: Gynecology

## 2013-08-01 VITALS — BP 120/78 | Ht 66.5 in | Wt 140.0 lb

## 2013-08-01 DIAGNOSIS — R29898 Other symptoms and signs involving the musculoskeletal system: Secondary | ICD-10-CM

## 2013-08-01 DIAGNOSIS — Z01419 Encounter for gynecological examination (general) (routine) without abnormal findings: Secondary | ICD-10-CM

## 2013-08-01 DIAGNOSIS — M6289 Other specified disorders of muscle: Secondary | ICD-10-CM

## 2013-08-01 NOTE — Progress Notes (Signed)
Cheryl Petersen 11/23/1965 [id number]   History:    48 y.o. presented to the office for her annual exam in the only complaint is one of weight gain and has felt that tiredness at times. Her recent CBC, comprehensive metabolic panel, lipid profile, TSH and urinalysis were normal. The patient has always had normal Pap smears in the past. She does her monthly breast exam. She does have dense breast and she is getting her annual mammogram 3D. Review of records indicated she had an endometrial ablation back in 2011.  Patient was weighing 128 pounds last year and is up to 140 but her BMI is less than 25. Her husband has had a vasectomy.  Past medical history,surgical history, family history and social history were all reviewed and documented in the EPIC chart.  Gynecologic History Patient's last menstrual period was 07/06/2013. Contraception: vasectomy Last Pap: 2013. Results were: normal Last mammogram: 2015. Results were: Normal but dense  Obstetric History OB History  Gravida Para Term Preterm AB SAB TAB Ectopic Multiple Living  3 2 2  1 1    2     # Outcome Date GA Lbr Len/2nd Weight Sex Delivery Anes PTL Lv  3 SAB           2 TRM     M CS  N Y  1 TRM     M SVD  N Y       ROS: A ROS was performed and pertinent positives and negatives are included in the history.  GENERAL: No fevers or chills. HEENT: No change in vision, no earache, sore throat or sinus congestion. NECK: No pain or stiffness. CARDIOVASCULAR: No chest pain or pressure. No palpitations. PULMONARY: No shortness of breath, cough or wheeze. GASTROINTESTINAL: No abdominal pain, nausea, vomiting or diarrhea, melena or bright red blood per rectum. GENITOURINARY: No urinary frequency, urgency, hesitancy or dysuria. MUSCULOSKELETAL: No joint or muscle pain, no back pain, no recent trauma. DERMATOLOGIC: No rash, no itching, no lesions. ENDOCRINE: No polyuria, polydipsia, no heat or cold intolerance. No recent change in weight.  HEMATOLOGICAL: No anemia or easy bruising or bleeding. NEUROLOGIC: No headache, seizures, numbness, tingling or weakness. PSYCHIATRIC: No depression, no loss of interest in normal activity or change in sleep pattern.     Exam: chaperone present  BP 120/78  Ht 5' 6.5" (1.689 m)  Wt 140 lb (63.504 kg)  BMI 22.26 kg/m2  LMP 07/06/2013  Body mass index is 22.26 kg/(m^2).  General appearance : Well developed well nourished female. No acute distress HEENT: Neck supple, trachea midline, no carotid bruits, no thyroidmegaly Lungs: Clear to auscultation, no rhonchi or wheezes, or rib retractions  Heart: Regular rate and rhythm, no murmurs or gallops Breast:Examined in sitting and supine position were symmetrical in appearance, no palpable masses or tenderness,  no skin retraction, no nipple inversion, no nipple discharge, no skin discoloration, no axillary or supraclavicular lymphadenopathy Abdomen: no palpable masses or tenderness, no rebound or guarding Extremities: no edema or skin discoloration or tenderness  Pelvic:  Bartholin, Urethra, Skene Glands: Within normal limits             Vagina: No gross lesions or discharge  Cervix: No gross lesions or discharge  Uterus  anteverted, normal size, shape and consistency, non-tender and mobile  Adnexa  Without masses or tenderness  Anus and perineum  normal   Rectovaginal  normal sphincter tone without palpated masses or tenderness  Hemoccult not indicated     Assessment/Plan:  48 y.o. female for annual exam : Well with the exception of Weight gain. All labs normal. Due to her tiredness amd muscle fatique at times we will check her vitamin D level today. No pap smear done today according to new guideline.  Note: This dictation was prepared with  Dragon/digital dictation along withSmart phrase technology. Any transcriptional errors that result from this process are unintentional.   Ok Edwards MD, 12:07 PM 08/01/2013

## 2013-08-02 LAB — VITAMIN D 25 HYDROXY (VIT D DEFICIENCY, FRACTURES): Vit D, 25-Hydroxy: 44 ng/mL (ref 30–89)

## 2013-08-04 ENCOUNTER — Encounter: Payer: BC Managed Care – PPO | Admitting: Gynecology

## 2013-08-31 ENCOUNTER — Other Ambulatory Visit: Payer: BC Managed Care – PPO

## 2013-09-07 ENCOUNTER — Encounter: Payer: BC Managed Care – PPO | Admitting: Internal Medicine

## 2013-11-07 ENCOUNTER — Encounter: Payer: Self-pay | Admitting: Gynecology

## 2014-06-13 ENCOUNTER — Telehealth: Payer: Self-pay | Admitting: *Deleted

## 2014-06-13 NOTE — Telephone Encounter (Signed)
Pt called requesting order faxed to [facility] for mammogram, I called pt back and left message no order needed to call and schedule.

## 2014-07-05 ENCOUNTER — Telehealth: Payer: Self-pay | Admitting: Internal Medicine

## 2014-07-05 DIAGNOSIS — L578 Other skin changes due to chronic exposure to nonionizing radiation: Secondary | ICD-10-CM

## 2014-07-05 NOTE — Telephone Encounter (Signed)
I havent seen her in 2 years   Dr Selena BattenKim saw her last year  I dont have a diagnosis  But see if sun damage   Would work    She is due for updated visit   If has more medical need than above

## 2014-07-05 NOTE — Telephone Encounter (Signed)
Patient need a referral to see the Dermatologist tomorrow at 11:30 Dr Karlyn Ageean Jones, Phone # [phone], Fax # [phone]    [address] [place] [id number]

## 2014-07-05 NOTE — Telephone Encounter (Signed)
Spoke to the pt.  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  Please advise.  Need dx code.  Pt aware this may not be done by 07/06/14 due to late time.

## 2014-07-06 NOTE — Telephone Encounter (Signed)
Referral placed in the system. 

## 2014-07-16 IMAGING — US US SOFT TISSUE HEAD/NECK
1 series · 13 of 25 positions shown · non-contrast
Comparison: Thyroid ultrasound - 01/14/2011; ultrasound-guided
right-sided thyroid nodule biopsy -04/03/2011

CLINICAL DATA: Thyroid goiter, history of prior right-sided thyroid
nodule biopsy in 3834 with benign pathology

EXAM:
THYROID ULTRASOUND
TECHNIQUE: Ultrasound examination of the thyroid gland and adjacent soft
tissues was performed.

[Series 1: us soft tissue head/neck · 0.09mm/px · 13 of 60 slices shown]
[im 1/60]
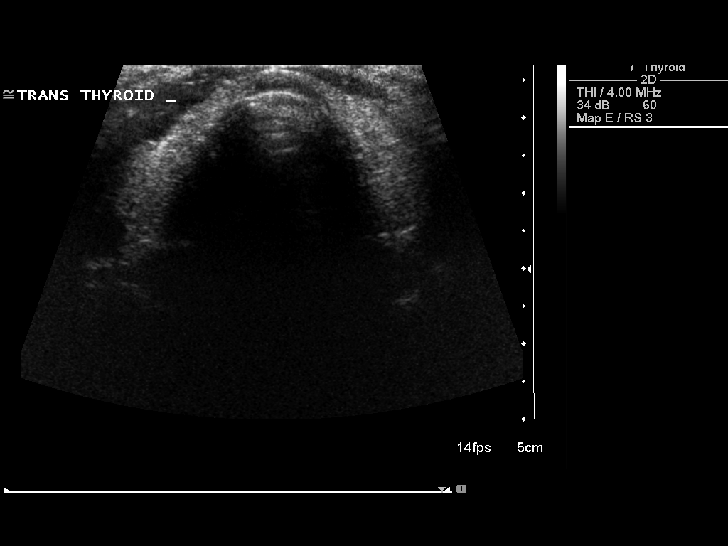
[im 5/60]
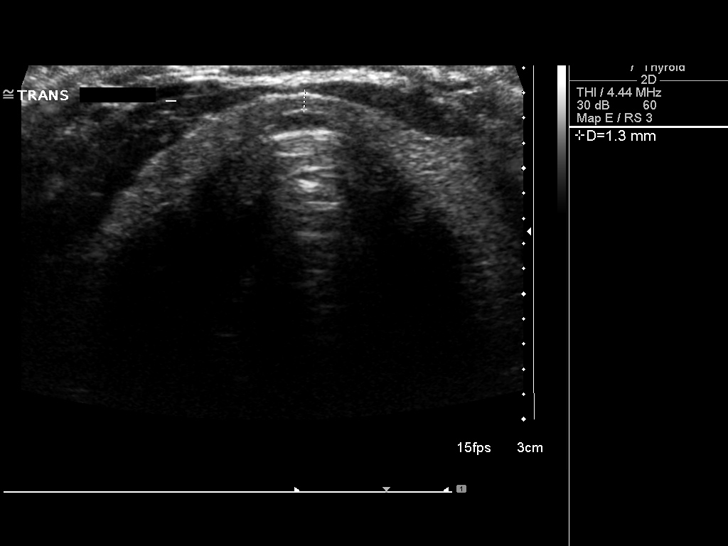
[im 10/60]
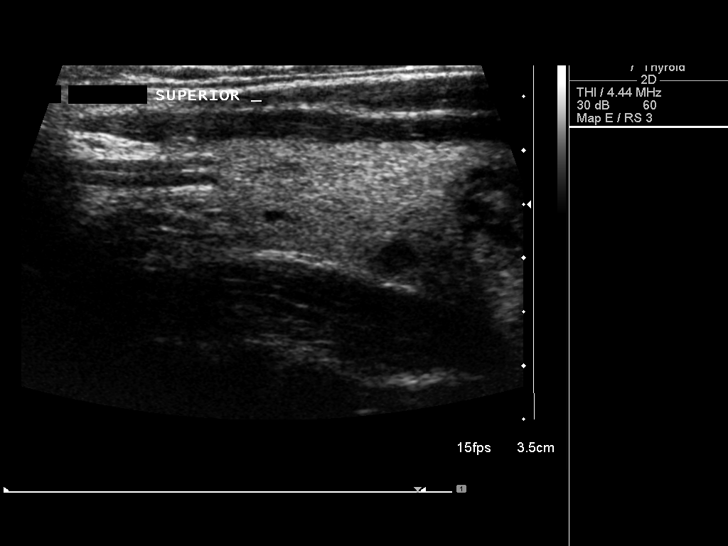
[im 15/60]
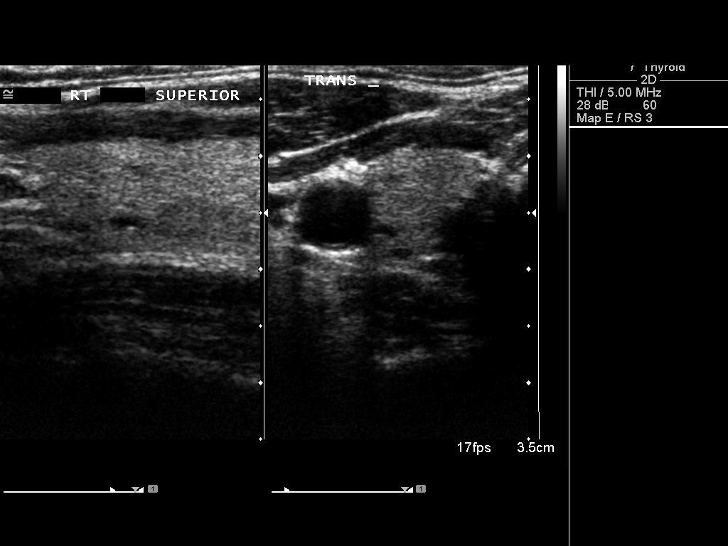
[im 20/60]
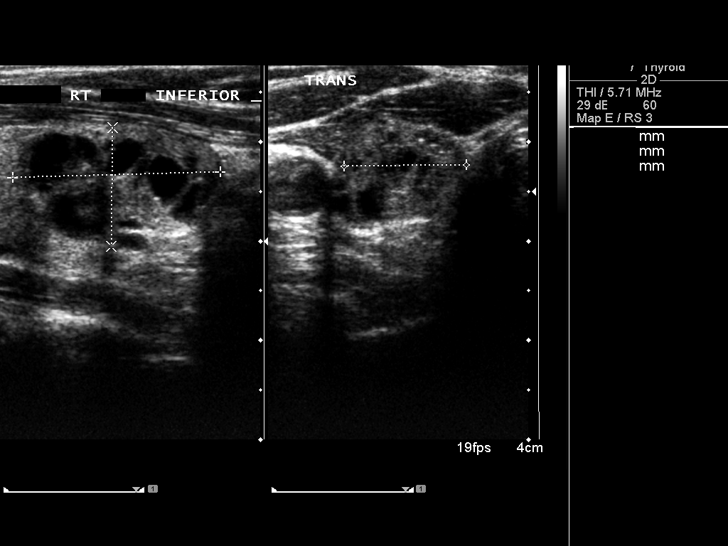
[im 25/60]
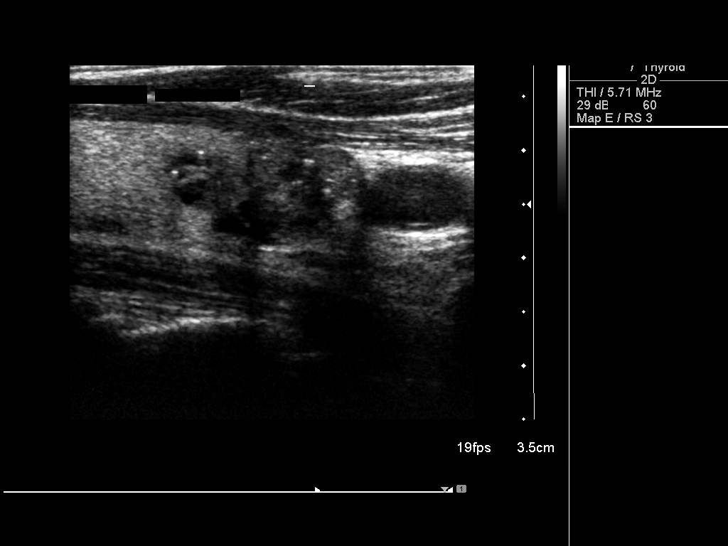
[im 30/60]
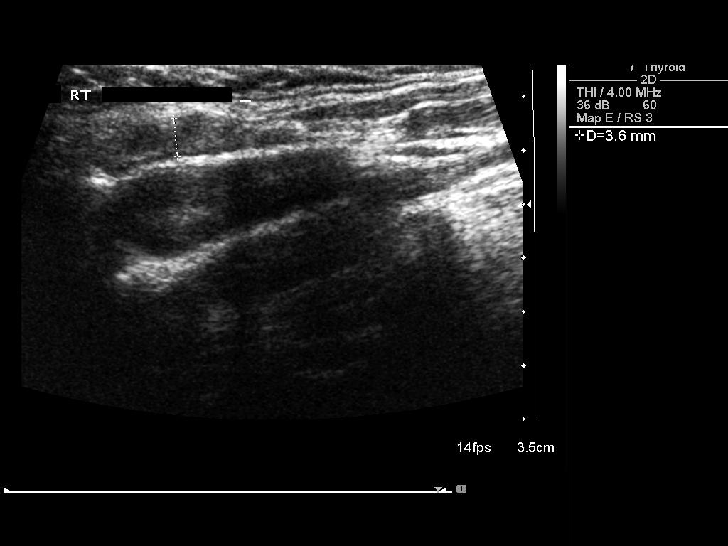
[im 35/60]
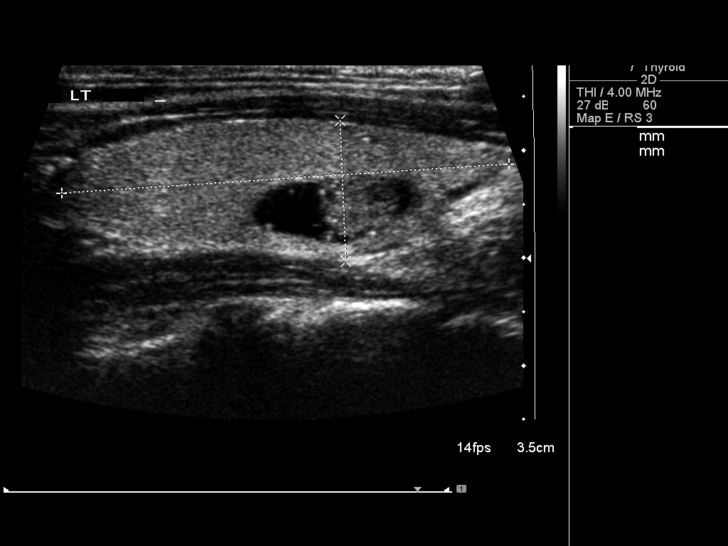
[im 40/60]
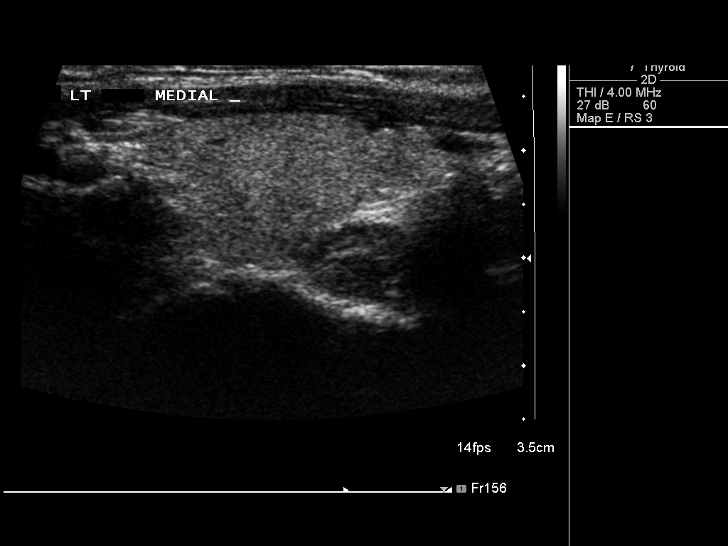
[im 45/60]
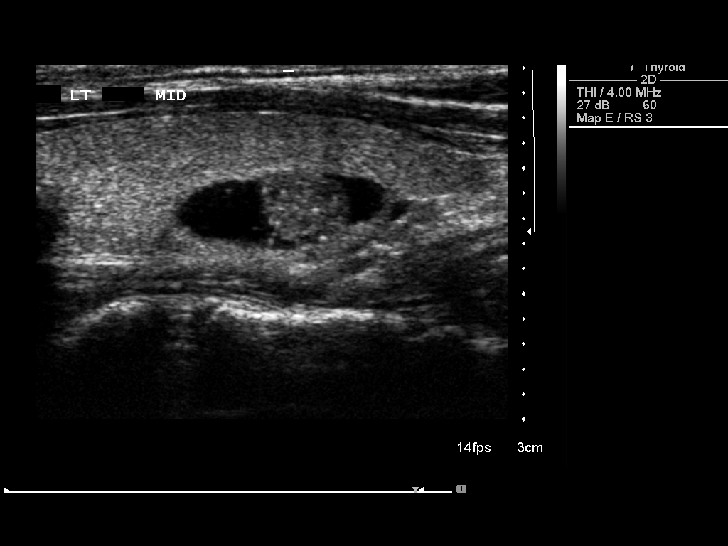
[im 50/60]
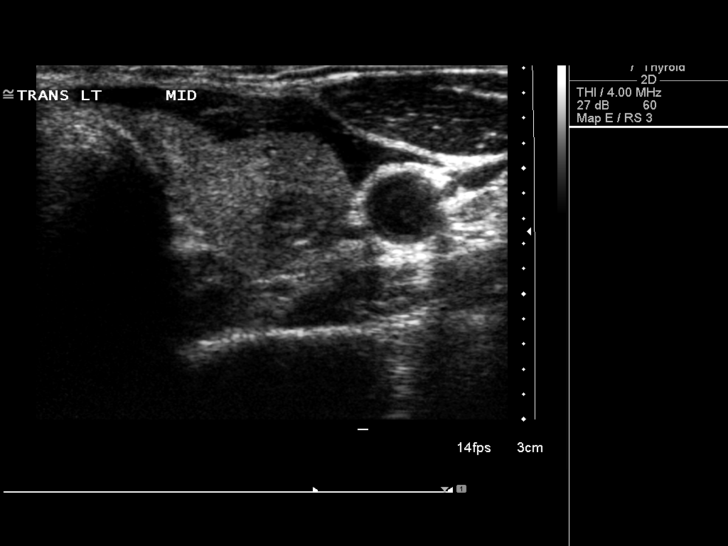
[im 55/60]
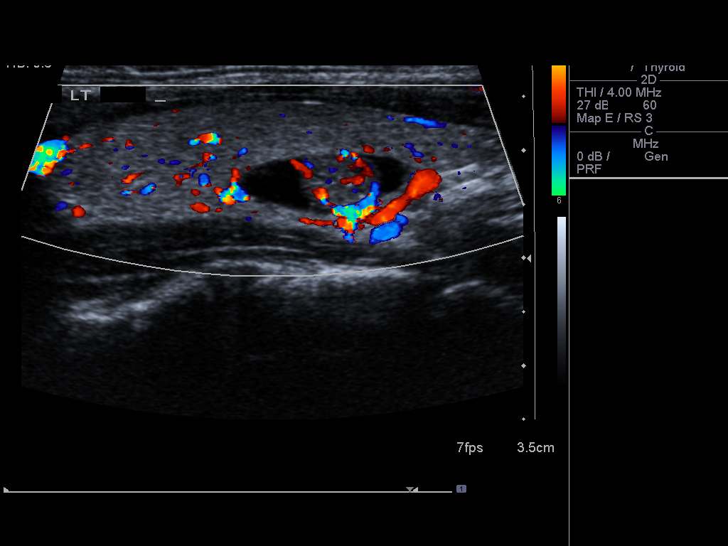
[im 60/60]
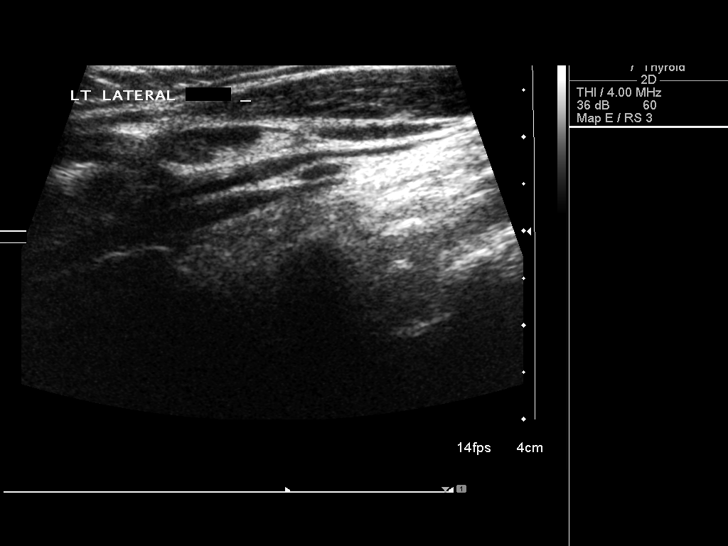

[13 of 25 positions shown; findings below may reference images not displayed]

FINDINGS: There is mild diffuse heterogeneity of the thyroid parenchyma. No
new thyroid nodules.

Right thyroid lobe

Measurements: Normal in size measuring 4.8 x 1.6 x 1.7 cm. No
nodules visualized.

Right, superior - 0.3 x 0.2 x 0.2 cm - anechoic with peripheral
echogenic nodule which demonstrates ring down artifact suggestive of
colloid - unchanged.

Right, mid, posterior - 0.5 x 0.4 x 0.5 cm - anechoic, likely cystic
- unchanged.

Right, inferior - 2.1 x 1.2 x 1.2 cm - mixed echogenic, partially
cystic, partially solid - note, the cystic component appears to have
increased in the interval (favored to represent cystic degeneration)
though the external diameter of the nodule is grossly unchanged,
previously, 2.1 x 1.2 x 1.4 cm.

Left thyroid lobe

Measurements: Normal in size measuring 4.2 x 1.3 x 1.3 cm.

Left, mid - 1.7 x 0.7 x 1.0 cm - mixed echogenic, partially cystic,
partially solid - grossly unchanged, 1.6 x 0.5 x 0.9 cm.

Isthmus

Thickness: Normal in size measuring 0.2 cm in diameter..

No discrete nodules are identified within the thyroid isthmus.

Lymphadenopathy

None visualized.
IMPRESSION: Grossly unchanged findings compatible multinodular goiter. No new
thyroid nodules.

## 2014-07-18 ENCOUNTER — Encounter: Payer: Self-pay | Admitting: Gynecology

## 2014-08-03 ENCOUNTER — Encounter: Payer: BC Managed Care – PPO | Admitting: Gynecology

## 2014-08-23 ENCOUNTER — Encounter: Payer: Self-pay | Admitting: Gynecology

## 2014-08-23 ENCOUNTER — Other Ambulatory Visit (HOSPITAL_COMMUNITY)
Admission: RE | Admit: 2014-08-23 | Discharge: 2014-08-23 | Disposition: A | Discharge status: Home / Self Care | Payer: 59 | Source: Ambulatory Visit | Attending: Gynecology | Admitting: Gynecology

## 2014-08-23 ENCOUNTER — Ambulatory Visit (INDEPENDENT_AMBULATORY_CARE_PROVIDER_SITE_OTHER): Payer: 59 | Admitting: Gynecology

## 2014-08-23 VITALS — BP 126/80 | Ht 66.0 in | Wt 143.0 lb

## 2014-08-23 DIAGNOSIS — Z1151 Encounter for screening for human papillomavirus (HPV): Secondary | ICD-10-CM | POA: Diagnosis present

## 2014-08-23 DIAGNOSIS — Z01419 Encounter for gynecological examination (general) (routine) without abnormal findings: Secondary | ICD-10-CM

## 2014-08-23 NOTE — Progress Notes (Signed)
Cheryl Petersen August 04, 1965 [id number]   History:    49 y.o.  for annual gyn exam with no complaints today. Patient in 2011 had an endometrial ablation and reports normal menstrual cycle has done well. Patient with no past history of any abnormal Pap smears. Her husband has had a vasectomy.She does her monthly breast exam. She does have dense breast and she is getting her annual mammogram 3D.  Past medical history,surgical history, family history and social history were all reviewed and documented in the EPIC chart.  Gynecologic History Patient's last menstrual period was 08/09/2014. Contraception: vasectomy Last Pap: 2013. Results were: normal Last mammogram: 2016. Results were: Normal three-dimensional mammogram because of history of dense breasts  Obstetric History OB History  Gravida Para Term Preterm AB SAB TAB Ectopic Multiple Living  # Outcome Date GA Lbr Len/2nd Weight Sex Delivery Anes PTL Lv  3 SAB           2 Term     M CS-Unspec  N Y  1 Term     M Vag-Spont  N Y       ROS: A ROS was performed and pertinent positives and negatives are included in the history.  GENERAL: No fevers or chills. HEENT: No change in vision, no earache, sore throat or sinus congestion. NECK: No pain or stiffness. CARDIOVASCULAR: No chest pain or pressure. No palpitations. PULMONARY: No shortness of breath, cough or wheeze. GASTROINTESTINAL: No abdominal pain, nausea, vomiting or diarrhea, melena or bright red blood per rectum. GENITOURINARY: No urinary frequency, urgency, hesitancy or dysuria. MUSCULOSKELETAL: No joint or muscle pain, no back pain, no recent trauma. DERMATOLOGIC: No rash, no itching, no lesions. ENDOCRINE: No polyuria, polydipsia, no heat or cold intolerance. No recent change in weight. HEMATOLOGICAL: No anemia or easy bruising or bleeding. NEUROLOGIC: No headache, seizures, numbness, tingling or weakness. PSYCHIATRIC: No depression, no loss of interest in normal  activity or change in sleep pattern.     Exam: chaperone present  BP 126/80 mmHg  Ht  (1.676 m)  Wt 143 lb (64.864 kg)  BMI 23.09 kg/m2  LMP 08/09/2014  Body mass index is 23.09 kg/(m^2).  General appearance : Well developed well nourished female. No acute distress HEENT: Eyes: no retinal hemorrhage or exudates,  Neck supple, trachea midline, no carotid bruits, no thyroidmegaly Lungs: Clear to auscultation, no rhonchi or wheezes, or rib retractions  Heart: Regular rate and rhythm, no murmurs or gallops Breast:Examined in sitting and supine position were symmetrical in appearance, no palpable masses or tenderness,  no skin retraction, no nipple inversion, no nipple discharge, no skin discoloration, no axillary or supraclavicular lymphadenopathy Abdomen: no palpable masses or tenderness, no rebound or guarding Extremities: no edema or skin discoloration or tenderness  Pelvic:  Bartholin, Urethra, Skene Glands: Within normal limits             Vagina: No gross lesions or discharge  Cervix: No gross lesions or discharge  Uterus  anteverted, normal size, shape and consistency, non-tender and mobile  Adnexa  Without masses or tenderness  Anus and perineum  normal   Rectovaginal  normal sphincter tone without palpated masses or tenderness             Hemoccult not indicated     Assessment/Plan:  49 y.o. female for annual exam for which Pap smear was done today. Her PCP we'll be doing her blood  work which is Dr. Fabian Sharp. We discussed importance of calcium and vitamin D and regular exercise for osteoporosis prevention. We discussed importance of monthly breast exam.   Ok Edwards MD, 5:25 PM 08/23/2014

## 2014-08-28 LAB — CYTOLOGY - PAP

## 2014-10-12 ENCOUNTER — Other Ambulatory Visit (INDEPENDENT_AMBULATORY_CARE_PROVIDER_SITE_OTHER): Payer: 59

## 2014-10-12 DIAGNOSIS — Z0000 Encounter for general adult medical examination without abnormal findings: Secondary | ICD-10-CM | POA: Diagnosis not present

## 2014-10-12 LAB — CBC WITH DIFFERENTIAL/PLATELET
BASOS ABS: 0 10*3/uL (ref 0.0–0.1)
Basophils Relative: 0.5 % (ref 0.0–3.0)
EOS PCT: 1.6 % (ref 0.0–5.0)
Eosinophils Absolute: 0.1 10*3/uL (ref 0.0–0.7)
HCT: 44.5 % (ref 36.0–46.0)
HEMOGLOBIN: 15.2 g/dL — AB (ref 12.0–15.0)
LYMPHS ABS: 1.3 10*3/uL (ref 0.7–4.0)
LYMPHS PCT: 23.1 % (ref 12.0–46.0)
MCHC: 34.1 g/dL (ref 30.0–36.0)
MCV: 90.2 fl (ref 78.0–100.0)
MONOS PCT: 8.1 % (ref 3.0–12.0)
Monocytes Absolute: 0.4 10*3/uL (ref 0.1–1.0)
NEUTROS PCT: 66.7 % (ref 43.0–77.0)
Neutro Abs: 3.7 10*3/uL (ref 1.4–7.7)
Platelets: 274 10*3/uL (ref 150.0–400.0)
RBC: 4.94 Mil/uL (ref 3.87–5.11)
RDW: 11.5 % (ref 11.5–15.5)
WBC: 5.5 10*3/uL (ref 4.0–10.5)

## 2014-10-12 LAB — LIPID PANEL
Cholesterol: 204 mg/dL — ABNORMAL HIGH (ref 0–200)
HDL: 72.9 mg/dL (ref >39.00)
LDL CALC: 118 mg/dL — AB (ref 0–99)
NONHDL: 131.26
Total CHOL/HDL Ratio: 3
Triglycerides: 64 mg/dL (ref 0.0–149.0)
VLDL: 12.8 mg/dL (ref 0.0–40.0)

## 2014-10-12 LAB — BASIC METABOLIC PANEL
BUN: 12 mg/dL (ref 6–23)
CALCIUM: 9.4 mg/dL (ref 8.4–10.5)
CO2: 29 mEq/L (ref 19–32)
Chloride: 102 mEq/L (ref 96–112)
Creatinine, Ser: 0.78 mg/dL (ref 0.40–1.20)
GFR: 83.93 mL/min (ref >60.00)
GLUCOSE: 88 mg/dL (ref 70–99)
POTASSIUM: 4.2 meq/L (ref 3.5–5.1)
SODIUM: 140 meq/L (ref 135–145)

## 2014-10-12 LAB — HEPATIC FUNCTION PANEL
ALBUMIN: 4.3 g/dL (ref 3.5–5.2)
ALK PHOS: 54 U/L (ref 39–117)
ALT: 14 U/L (ref 0–35)
AST: 16 U/L (ref 0–37)
Bilirubin, Direct: 0.1 mg/dL (ref 0.0–0.3)
Total Bilirubin: 0.6 mg/dL (ref 0.2–1.2)
Total Protein: 7.4 g/dL (ref 6.0–8.3)

## 2014-10-12 LAB — TSH: TSH: 1.29 u[IU]/mL (ref 0.35–4.50)

## 2014-10-16 ENCOUNTER — Other Ambulatory Visit: Payer: BC Managed Care – PPO

## 2014-10-25 ENCOUNTER — Ambulatory Visit (INDEPENDENT_AMBULATORY_CARE_PROVIDER_SITE_OTHER): Payer: 59 | Admitting: Internal Medicine

## 2014-10-25 ENCOUNTER — Encounter: Payer: Self-pay | Admitting: Internal Medicine

## 2014-10-25 VITALS — BP 138/80 | Temp 98.5°F | Ht 66.5 in | Wt 143.7 lb

## 2014-10-25 DIAGNOSIS — R7611 Nonspecific reaction to tuberculin skin test without active tuberculosis: Secondary | ICD-10-CM | POA: Insufficient documentation

## 2014-10-25 DIAGNOSIS — Z0000 Encounter for general adult medical examination without abnormal findings: Secondary | ICD-10-CM | POA: Diagnosis not present

## 2014-10-25 DIAGNOSIS — E042 Nontoxic multinodular goiter: Secondary | ICD-10-CM

## 2014-10-25 NOTE — Progress Notes (Signed)
Pre visit review using our clinic review tool, if applicable. No additional management support is needed unless otherwise documented below in the visit note.  Chief Complaint  Patient presents with  . Annual Exam    HPI: Patient  Cheryl Petersen  49 y.o. 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  Has pos ppd  rx at age 73 or so neg c xays last one 5-6 years ago but no sx   MNG didn't get back to dr balan last year insurance change but had stable [place]  nosx    Health Maintenance  Topic Date Due  . HIV Screening  10/25/2015 (Originally 03/12/1980)  . INFLUENZA VACCINE  08/07/2015  . TETANUS/TDAP  12/10/2015  . PAP SMEAR  08/22/2017   Health Maintenance Review LIFESTYLE:  Exercise:  Could do better  Tobacco/ETS:no Alcohol: not much  Sugar beverages: no Sleep: 7-8 hours  Drug use: no  Endometrial ablation   ROS:  GEN/ HEENT: No fever, significant weight changes sweats headaches vision problems hearing changes, CV/ PULM; No chest pain shortness of breath cough, syncope,edema  change in exercise tolerance. GI /GU: No adominal pain, vomiting, change in bowel habits. No blood in the stool. No significant GU symptoms. SKIN/HEME: ,no acute skin rashes suspicious lesions or bleeding. No lymphadenopathy, nodules, masses.  NEURO/ PSYCH:  No neurologic signs such as weakness numbness. No depression anxiety. IMM/ Allergy: No unusual infections.  Allergy .   REST of 12 system review negative except as per HPI   Past Medical History  Diagnosis Date  . 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 Allergic rhinitis   . Multinodular goiter (nontoxic)     benign bx in 2013  last [place] 2015 stable    Past Surgical History  Procedure Laterality Date  . No past surgeries    . Endometrial ablation      2011  . Cesarean section      Family History  Problem Relation Age of  Onset  . Healthy Mother   . Hypertension Father   . Hyperlipidemia Father   . Cancer Maternal Grandmother     skin  . Cancer Maternal Grandfather     skin    Social History   Social History  . Marital Status: Married    Spouse Name: N/A  . Number of Children: N/A  . Years of Education: N/A   Social History Main Topics  . Smoking status: Never Smoker   . Smokeless tobacco: Never Used  . Alcohol Use: Yes     Comment: OCC  . Drug Use: No  . Sexual Activity: Yes   Other Topics Concern  . 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    Two children age 40 and 47   HHof 4  Pet dog.    No outpatient prescriptions prior to visit.   No facility-administered medications prior to visit.     EXAM:  BP 138/80 mmHg  Temp(Src) 98.5 F (36.9 C) (Temporal)  Ht 5' 6.5" (1.689 m)  Wt 143 lb 11.2 oz (65.182 kg)  BMI 22.85 kg/m2  Body mass index is 22.85 kg/(m^2).  Physical Exam: Vital signs reviewed [id number]:[id number] is a well-developed well-nourished alert cooperative    who appearsr  stated age in no acute distress.  HEENT: normocephalic atraumatic , Eyes: PERRL EOM's full, conjunctiva clear, Nares: paten,t no deformity discharge or tenderness., Ears: no deformity EAC's clear TMs with normal landmarks. Mouth: clear OP, no lesions, edema.  Moist mucous membranes. Dentition in adequate repair. NECK: supple without masses, pal no nodule felt  CHEST/PULM:  Clear to auscultation and percussion breath sounds equal no wheeze , rales or rhonchi. No chest wall deformities or tenderness. Breast declined done by gyne inlast few months  CV: PMI is nondisplaced, S1 S2 no gallops, murmurs, rubs. Peripheral pulses are full without delay.No JVD .  ABDOMEN: Bowel sounds normal nontender  No guard or rebound, no hepato splenomegal no CVA tenderness.  No hernia. Extremtities:  No clubbing cyanosis or edema, no acute joint swelling or redness  no focal atrophy NEURO:  Oriented x3, cranial nerves 3-12 appear to be intact, no obvious focal weakness,gait within normal limits no abnormal reflexes or asymmetrical SKIN: No acute rashes normal turgor, color, no bruising or petechiae. PSYCH: Oriented, good eye contact, no obvious depression anxiety, cognition and judgment appear normal. LN: no cervical axillary inguinal adenopathy  Lab Results  Component Value Date   WBC 5.5 10/12/2014   HGB 15.2* 10/12/2014   HCT 44.5 10/12/2014   PLT 274.0 10/12/2014   GLUCOSE 88 10/12/2014   CHOL 204* 10/12/2014   TRIG 64.0 10/12/2014   HDL 72.90 10/12/2014   LDLCALC 118* 10/12/2014   ALT 14 10/12/2014   AST 16 10/12/2014   NA 140 10/12/2014   K 4.2 10/12/2014   CL 102 10/12/2014   CREATININE 0.78 10/12/2014   BUN 12 10/12/2014   CO2 29 10/12/2014   TSH 1.29 10/12/2014   BP Readings from Last 3 Encounters:  10/25/14 138/80  08/23/14 126/80  08/01/13 120/78   Wt Readings from Last 3 Encounters:  10/25/14 143 lb 11.2 oz (65.182 kg)  08/23/14 143 lb (64.864 kg)  08/01/13 140 lb (63.504 kg)    ASSESSMENT AND PLAN:  Discussed the following assessment and plan:  Visit for preventive health examination uncert sig of hg 15 follow  blod count  Patient Care Team: Burnis Medin, MD as PCP - General (Internal Medicine) Danella Sensing, MD (Dermatology) gyne Terrance Mass, MD as Consulting Physician (Gynecology) Patient Instructions  Continue lifestyle intervention healthy eating and exercise .  Last US neck stable multinodular goiter.  Plan  See dr Chalmers Cater next year or as needed .     Health Maintenance, Female Adopting a healthy lifestyle and getting preventive care can go a long way to promote health and wellness. Talk with your health care provider about what schedule of regular examinations is right for you. This is a good chance for you to check in with your provider about disease prevention and staying healthy. In between  checkups, there are plenty of things you can do on your own. Experts have done a lot of research about which lifestyle changes and preventive measures are most likely to keep you healthy. Ask your health care provider for more information. WEIGHT AND DIET  Eat a healthy diet  Be sure to include plenty of vegetables, fruits, low-fat dairy products, and lean protein.  Do not eat a lot of foods high in solid fats, added sugars, or salt.  Get regular exercise. This is one of the most important things you can do for your health.  Most adults should exercise for at least 150 minutes each week. The exercise should increase  your heart rate and make you sweat (moderate-intensity exercise).  Most adults should also do strengthening exercises at least twice a week. This is in addition to the moderate-intensity exercise.  Maintain a healthy weight  Body mass index (BMI) is a measurement that can be used to identify possible weight problems. It estimates body fat based on height and weight. Your health care provider can help determine your BMI and help you achieve or maintain a healthy weight.  For females 29 years of age and older:   A BMI below 18.5 is considered underweight.  A BMI of 18.5 to 24.9 is normal.  A BMI of 25 to 29.9 is considered overweight.  A BMI of 30 and above is considered obese.  Watch levels of cholesterol and blood lipids  You should start having your blood tested for lipids and cholesterol at 47 years of age, then have this test every 5 years.  You may need to have your cholesterol levels checked more often if:  Your lipid or cholesterol levels are high.  You are older than 47 years of age.  You are at high risk for heart disease.  CANCER SCREENING   Lung Cancer  Lung cancer screening is recommended for adults 20-79 years old who are at high risk for lung cancer because of a history of smoking.  A yearly low-dose CT scan of the lungs is recommended for  people who:  Currently smoke.  Have quit within the past 15 years.  Have at least a 30-pack-year history of smoking. A pack year is smoking an average of one pack of cigarettes a day for 1 year.  Yearly screening should continue until it has been 15 years since you quit.  Yearly screening should stop if you develop a health problem that would prevent you from having lung cancer treatment.  Breast Cancer  Practice breast self-awareness. This means understanding how your breasts normally appear and feel.  It also means doing regular breast self-exams. Let your health care provider know about any changes, no matter how small.  If you are in your 20s or 30s, you should have a clinical breast exam (CBE) by a health care provider every 1-3 years as part of a regular health exam.  If you are 14 or older, have a CBE every year. Also consider having a breast X-ray (mammogram) every year.  If you have a family history of breast cancer, talk to your health care provider about genetic screening.  If you are at high risk for breast cancer, talk to your health care provider about having an MRI and a mammogram every year.  Breast cancer gene (BRCA) assessment is recommended for women who have family members with BRCA-related cancers. BRCA-related cancers include:  Breast.  Ovarian.  Tubal.  Peritoneal cancers.  Results of the assessment will determine the need for genetic counseling and BRCA1 and BRCA2 testing. Cervical Cancer Your health care provider may recommend that you be screened regularly for cancer of the pelvic organs (ovaries, uterus, and vagina). This screening involves a pelvic examination, including checking for microscopic changes to the surface of your cervix (Pap test). You may be encouraged to have this screening done every 3 years, beginning at age 72.  For women ages 35-65, health care providers may recommend pelvic exams and Pap testing every 3 years, or they may  recommend the Pap and pelvic exam, combined with testing for human papilloma virus (HPV), every 5 years. Some types of HPV increase your risk  of cervical cancer. Testing for HPV may also be done on women of any age with unclear Pap test results.  Other health care providers may not recommend any screening for nonpregnant women who are considered low risk for pelvic cancer and who do not have symptoms. Ask your health care provider if a screening pelvic exam is right for you.  If you have had past treatment for cervical cancer or a condition that could lead to cancer, you need Pap tests and screening for cancer for at least 20 years after your treatment. If Pap tests have been discontinued, your risk factors (such as having a new sexual partner) need to be reassessed to determine if screening should resume. Some women have medical problems that increase the chance of getting cervical cancer. In these cases, your health care provider may recommend more frequent screening and Pap tests. Colorectal Cancer  This type of cancer can be detected and often prevented.  Routine colorectal cancer screening usually begins at 47 years of age and continues through 47 years of age.  Your health care provider may recommend screening at an earlier age if you have risk factors for colon cancer.  Your health care provider may also recommend using home test kits to check for hidden blood in the stool.  A small camera at the end of a tube can be used to examine your colon directly (sigmoidoscopy or colonoscopy). This is done to check for the earliest forms of colorectal cancer.  Routine screening usually begins at age 37.  Direct examination of the colon should be repeated every 5-10 years through 47 years of age. However, you may need to be screened more often if early forms of precancerous polyps or small growths are found. Skin Cancer  Check your skin from head to toe regularly.  Tell your health care provider  about any new moles or changes in moles, especially if there is a change in a mole's shape or color.  Also tell your health care provider if you have a mole that is larger than the size of a pencil eraser.  Always use sunscreen. Apply sunscreen liberally and repeatedly throughout the day.  Protect yourself by wearing long sleeves, pants, a wide-brimmed hat, and sunglasses whenever you are outside. HEART DISEASE, DIABETES, AND HIGH BLOOD PRESSURE   High blood pressure causes heart disease and increases the risk of stroke. High blood pressure is more likely to develop in:  People who have blood pressure in the high end of the normal range (130-139/85-89 mm Hg).  People who are overweight or obese.  People who are African American.  If you are 37-62 years of age, have your blood pressure checked every 3-5 years. If you are 94 years of age or older, have your blood pressure checked every year. You should have your blood pressure measured twice--once when you are at a hospital or clinic, and once when you are not at a hospital or clinic. Record the average of the two measurements. To check your blood pressure when you are not at a hospital or clinic, you can use:  An automated blood pressure machine at a pharmacy.  A home blood pressure monitor.  If you are between 40 years and 37 years old, ask your health care provider if you should take aspirin to prevent strokes.  Have regular diabetes screenings. This involves taking a blood sample to check your fasting blood sugar level.  If you are at a normal weight and have a low  risk for diabetes, have this test once every three years after 47 years of age.  If you are overweight and have a high risk for diabetes, consider being tested at a younger age or more often. PREVENTING INFECTION  Hepatitis B  If you have a higher risk for hepatitis B, you should be screened for this virus. You are considered at high risk for hepatitis B if:  You were  born in a country where hepatitis B is common. Ask your health care provider which countries are considered high risk.  Your parents were born in a high-risk country, and you have not been immunized against hepatitis B (hepatitis B vaccine).  You have HIV or AIDS.  You use needles to inject street drugs.  You live with someone who has hepatitis B.  You have had sex with someone who has hepatitis B.  You get hemodialysis treatment.  You take certain medicines for conditions, including cancer, organ transplantation, and autoimmune conditions. Hepatitis C  Blood testing is recommended for:  Everyone born from [birth year] through 1963.  Anyone with known risk factors for hepatitis C. Sexually transmitted infections (STIs)  You should be screened for sexually transmitted infections (STIs) including gonorrhea and chlamydia if:  You are sexually active and are younger than 47 years of age.  You are older than 47 years of age and your health care provider tells you that you are at risk for this type of infection.  Your sexual activity has changed since you were last screened and you are at an increased risk for chlamydia or gonorrhea. Ask your health care provider if you are at risk.  If you do not have HIV, but are at risk, it may be recommended that you take a prescription medicine daily to prevent HIV infection. This is called pre-exposure prophylaxis (PrEP). You are considered at risk if:  You are sexually active and do not regularly use condoms or know the HIV status of your partner(s).  You take drugs by injection.  You are sexually active with a partner who has HIV. Talk with your health care provider about whether you are at high risk of being infected with HIV. If you choose to begin PrEP, you should first be tested for HIV. You should then be tested every 3 months for as long as you are taking PrEP.  PREGNANCY   If you are premenopausal and you may become pregnant, ask your  health care provider about preconception counseling.  If you may become pregnant, take 400 to 800 micrograms (mcg) of folic acid every day.  If you want to prevent pregnancy, talk to your health care provider about birth control (contraception). OSTEOPOROSIS AND MENOPAUSE   Osteoporosis is a disease in which the bones lose minerals and strength with aging. This can result in serious bone fractures. Your risk for osteoporosis can be identified using a bone density scan.  If you are 60 years of age or older, or if you are at risk for osteoporosis and fractures, ask your health care provider if you should be screened.  Ask your health care provider whether you should take a calcium or vitamin D supplement to lower your risk for osteoporosis.  Menopause may have certain physical symptoms and risks.  Hormone replacement therapy may reduce some of these symptoms and risks. Talk to your health care provider about whether hormone replacement therapy is right for you.  HOME CARE INSTRUCTIONS   Schedule regular health, dental, and eye exams.  Stay current  with your immunizations.   Do not use any tobacco products including cigarettes, chewing tobacco, or electronic cigarettes.  If you are pregnant, do not drink alcohol.  If you are breastfeeding, limit how much and how often you drink alcohol.  Limit alcohol intake to no more than 1 drink per day for nonpregnant women. One drink equals 12 ounces of beer, 5 ounces of wine, or 1 ounces of hard liquor.  Do not use street drugs.  Do not share needles.  Ask your health care provider for help if you need support or information about quitting drugs.  Tell your health care provider if you often feel depressed.  Tell your health care provider if you have ever been abused or do not feel safe at home.   This information is not intended to replace advice given to you by your health care provider. Make sure you discuss any questions you have with  your health care provider.   Document Released: 07/08/2010 Document Revised: 01/13/2014 Document Reviewed: 11/24/2012 Elsevier Interactive Patient Education 2016 [place] K. Leyani Gargus M.D.

## 2014-10-25 NOTE — Patient Instructions (Addendum)
Continue lifestyle intervention healthy eating and exercise .  Last US neck stable multinodular goiter.  Plan  See dr Chalmers Cater next year or as needed .     Health Maintenance, Female Adopting a healthy lifestyle and getting preventive care can go a long way to promote health and wellness. Talk with your health care provider about what schedule of regular examinations is right for you. This is a good chance for you to check in with your provider about disease prevention and staying healthy. In between checkups, there are plenty of things you can do on your own. Experts have done a lot of research about which lifestyle changes and preventive measures are most likely to keep you healthy. Ask your health care provider for more information. WEIGHT AND DIET  Eat a healthy diet  Be sure to include plenty of vegetables, fruits, low-fat dairy products, and lean protein.  Do not eat a lot of foods high in solid fats, added sugars, or salt.  Get regular exercise. This is one of the most important things you can do for your health.  Most adults should exercise for at least 150 minutes each week. The exercise should increase your heart rate and make you sweat (moderate-intensity exercise).  Most adults should also do strengthening exercises at least twice a week. This is in addition to the moderate-intensity exercise.  Maintain a healthy weight  Body mass index (BMI) is a measurement that can be used to identify possible weight problems. It estimates body fat based on height and weight. Your health care provider can help determine your BMI and help you achieve or maintain a healthy weight.  For females 47 years of age and older:   A BMI below 18.5 is considered underweight.  A BMI of 18.5 to 24.9 is normal.  A BMI of 25 to 29.9 is considered overweight.  A BMI of 30 and above is considered obese.  Watch levels of cholesterol and blood lipids  You should start having your blood tested for  lipids and cholesterol at 47 years of age, then have this test every 5 years.  You may need to have your cholesterol levels checked more often if:  Your lipid or cholesterol levels are high.  You are older than 47 years of age.  You are at high risk for heart disease.  CANCER SCREENING   Lung Cancer  Lung cancer screening is recommended for adults 47-29 years old who are at high risk for lung cancer because of a history of smoking.  A yearly low-dose CT scan of the lungs is recommended for people who:  Currently smoke.  Have quit within the past 15 years.  Have at least a 30-pack-year history of smoking. A pack year is smoking an average of one pack of cigarettes a day for 1 year.  Yearly screening should continue until it has been 15 years since you quit.  Yearly screening should stop if you develop a health problem that would prevent you from having lung cancer treatment.  Breast Cancer  Practice breast self-awareness. This means understanding how your breasts normally appear and feel.  It also means doing regular breast self-exams. Let your health care provider know about any changes, no matter how small.  If you are in your 20s or 30s, you should have a clinical breast exam (CBE) by a health care provider every 1-3 years as part of a regular health exam.  If you are 38 or older, have a CBE every year.  Also consider having a breast X-ray (mammogram) every year.  If you have a family history of breast cancer, talk to your health care provider about genetic screening.  If you are at high risk for breast cancer, talk to your health care provider about having an MRI and a mammogram every year.  Breast cancer gene (BRCA) assessment is recommended for women who have family members with BRCA-related cancers. BRCA-related cancers include:  Breast.  Ovarian.  Tubal.  Peritoneal cancers.  Results of the assessment will determine the need for genetic counseling and BRCA1  and BRCA2 testing. Cervical Cancer Your health care provider may recommend that you be screened regularly for cancer of the pelvic organs (ovaries, uterus, and vagina). This screening involves a pelvic examination, including checking for microscopic changes to the surface of your cervix (Pap test). You may be encouraged to have this screening done every 3 years, beginning at age 20.  For women ages 5-65, health care providers may recommend pelvic exams and Pap testing every 3 years, or they may recommend the Pap and pelvic exam, combined with testing for human papilloma virus (HPV), every 5 years. Some types of HPV increase your risk of cervical cancer. Testing for HPV may also be done on women of any age with unclear Pap test results.  Other health care providers may not recommend any screening for nonpregnant women who are considered low risk for pelvic cancer and who do not have symptoms. Ask your health care provider if a screening pelvic exam is right for you.  If you have had past treatment for cervical cancer or a condition that could lead to cancer, you need Pap tests and screening for cancer for at least 20 years after your treatment. If Pap tests have been discontinued, your risk factors (such as having a new sexual partner) need to be reassessed to determine if screening should resume. Some women have medical problems that increase the chance of getting cervical cancer. In these cases, your health care provider may recommend more frequent screening and Pap tests. Colorectal Cancer  This type of cancer can be detected and often prevented.  Routine colorectal cancer screening usually begins at 47 years of age and continues through 47 years of age.  Your health care provider may recommend screening at an earlier age if you have risk factors for colon cancer.  Your health care provider may also recommend using home test kits to check for hidden blood in the stool.  A small camera at the  end of a tube can be used to examine your colon directly (sigmoidoscopy or colonoscopy). This is done to check for the earliest forms of colorectal cancer.  Routine screening usually begins at age 47.  Direct examination of the colon should be repeated every 5-10 years through 47 years of age. However, you may need to be screened more often if early forms of precancerous polyps or small growths are found. Skin Cancer  Check your skin from head to toe regularly.  Tell your health care provider about any new moles or changes in moles, especially if there is a change in a mole's shape or color.  Also tell your health care provider if you have a mole that is larger than the size of a pencil eraser.  Always use sunscreen. Apply sunscreen liberally and repeatedly throughout the day.  Protect yourself by wearing long sleeves, pants, a wide-brimmed hat, and sunglasses whenever you are outside. HEART DISEASE, DIABETES, AND HIGH BLOOD PRESSURE  High blood pressure causes heart disease and increases the risk of stroke. High blood pressure is more likely to develop in:  People who have blood pressure in the high end of the normal range (130-139/85-89 mm Hg).  People who are overweight or obese.  People who are African American.  If you are 18-39 years of age, have your blood pressure checked every 3-5 years. If you are 40 years of age or older, have your blood pressure checked every year. You should have your blood pressure measured twice--once when you are at a hospital or clinic, and once when you are not at a hospital or clinic. Record the average of the two measurements. To check your blood pressure when you are not at a hospital or clinic, you can use:  An automated blood pressure machine at a pharmacy.  A home blood pressure monitor.  If you are between 55 years and 79 years old, ask your health care provider if you should take aspirin to prevent strokes.  Have regular diabetes  screenings. This involves taking a blood sample to check your fasting blood sugar level.  If you are at a normal weight and have a low risk for diabetes, have this test once every three years after 47 years of age.  If you are overweight and have a high risk for diabetes, consider being tested at a younger age or more often. PREVENTING INFECTION  Hepatitis B  If you have a higher risk for hepatitis B, you should be screened for this virus. You are considered at high risk for hepatitis B if:  You were born in a country where hepatitis B is common. Ask your health care provider which countries are considered high risk.  Your parents were born in a high-risk country, and you have not been immunized against hepatitis B (hepatitis B vaccine).  You have HIV or AIDS.  You use needles to inject street drugs.  You live with someone who has hepatitis B.  You have had sex with someone who has hepatitis B.  You get hemodialysis treatment.  You take certain medicines for conditions, including cancer, organ transplantation, and autoimmune conditions. Hepatitis C  Blood testing is recommended for:  Everyone born from 1945 through 1965.  Anyone with known risk factors for hepatitis C. Sexually transmitted infections (STIs)  You should be screened for sexually transmitted infections (STIs) including gonorrhea and chlamydia if:  You are sexually active and are younger than 47 years of age.  You are older than 47 years of age and your health care provider tells you that you are at risk for this type of infection.  Your sexual activity has changed since you were last screened and you are at an increased risk for chlamydia or gonorrhea. Ask your health care provider if you are at risk.  If you do not have HIV, but are at risk, it may be recommended that you take a prescription medicine daily to prevent HIV infection. This is called pre-exposure prophylaxis (PrEP). You are considered at risk  if:  You are sexually active and do not regularly use condoms or know the HIV status of your partner(s).  You take drugs by injection.  You are sexually active with a partner who has HIV. Talk with your health care provider about whether you are at high risk of being infected with HIV. If you choose to begin PrEP, you should first be tested for HIV. You should then be tested every 3 months for as   long as you are taking PrEP.  PREGNANCY   If you are premenopausal and you may become pregnant, ask your health care provider about preconception counseling.  If you may become pregnant, take 400 to 800 micrograms (mcg) of folic acid every day.  If you want to prevent pregnancy, talk to your health care provider about birth control (contraception). OSTEOPOROSIS AND MENOPAUSE   Osteoporosis is a disease in which the bones lose minerals and strength with aging. This can result in serious bone fractures. Your risk for osteoporosis can be identified using a bone density scan.  If you are 30 years of age or older, or if you are at risk for osteoporosis and fractures, ask your health care provider if you should be screened.  Ask your health care provider whether you should take a calcium or vitamin D supplement to lower your risk for osteoporosis.  Menopause may have certain physical symptoms and risks.  Hormone replacement therapy may reduce some of these symptoms and risks. Talk to your health care provider about whether hormone replacement therapy is right for you.  HOME CARE INSTRUCTIONS   Schedule regular health, dental, and eye exams.  Stay current with your immunizations.   Do not use any tobacco products including cigarettes, chewing tobacco, or electronic cigarettes.  If you are pregnant, do not drink alcohol.  If you are breastfeeding, limit how much and how often you drink alcohol.  Limit alcohol intake to no more than 1 drink per day for nonpregnant women. One drink equals 12  ounces of beer, 5 ounces of wine, or 1 ounces of hard liquor.  Do not use street drugs.  Do not share needles.  Ask your health care provider for help if you need support or information about quitting drugs.  Tell your health care provider if you often feel depressed.  Tell your health care provider if you have ever been abused or do not feel safe at home.   This information is not intended to replace advice given to you by your health care provider. Make sure you discuss any questions you have with your health care provider.   Document Released: 07/08/2010 Document Revised: 01/13/2014 Document Reviewed: 11/24/2012 Elsevier Interactive Patient Education [employer].

## 2014-11-14 ENCOUNTER — Other Ambulatory Visit: Payer: BC Managed Care – PPO

## 2014-11-27 ENCOUNTER — Ambulatory Visit (INDEPENDENT_AMBULATORY_CARE_PROVIDER_SITE_OTHER): Payer: 59 | Admitting: Family Medicine

## 2014-11-27 ENCOUNTER — Telehealth: Payer: Self-pay | Admitting: Internal Medicine

## 2014-11-27 ENCOUNTER — Encounter: Payer: Self-pay | Admitting: Family Medicine

## 2014-11-27 VITALS — BP 102/68 | HR 86 | Temp 97.4°F | Ht 66.5 in | Wt 144.4 lb

## 2014-11-27 DIAGNOSIS — J329 Chronic sinusitis, unspecified: Secondary | ICD-10-CM

## 2014-11-27 MED ORDER — FLUTICASONE PROPIONATE 50 MCG/ACT NA SUSP: 2.0000 | Freq: Every day | NASAL | Status: DC

## 2014-11-27 NOTE — Telephone Encounter (Signed)
Sent to the pharmacy by e-scribe. 

## 2014-11-27 NOTE — Progress Notes (Signed)
Pre visit review using our clinic review tool, if applicable. No additional management support is needed unless otherwise documented below in the visit note. 

## 2014-11-27 NOTE — Telephone Encounter (Signed)
Ok x 1 year 

## 2014-11-27 NOTE — Progress Notes (Signed)
   HPI:  URI: -started: yesterday -symptoms:nasal congestion, bilateral ear pain -denies:fever, SOB, NVD, tooth pain -has tried: czyrtec -sick contacts/travel/risks: denies flu exposure, tick exposure or or Ebola risks -Hx of: allergies - uses zyrtec and nasal sprays sometimes  ROS: See pertinent positives and negatives per HPI.  Past Medical History  Diagnosis Date  . 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 Allergic rhinitis   . Multinodular goiter (nontoxic)     benign bx in 2013  last us 2015 stable    Past Surgical History  Procedure Laterality Date  . No past surgeries    . Endometrial ablation      2011  . Cesarean section      Family History  Problem Relation Age of Onset  . Healthy Mother   . Hypertension Father   . Hyperlipidemia Father   . Cancer Maternal Grandmother     skin  . Cancer Maternal Grandfather     skin    Social History   Social History  . Marital Status: Married    Spouse Name: N/A  . Number of Children: N/A  . Years of Education: N/A   Social History Main Topics  . Smoking status: Never Smoker   . Smokeless tobacco: Never Used  . Alcohol Use: Yes     Comment: OCC  . Drug Use: No  . Sexual Activity: Yes   Other Topics Concern  . 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    Two children age 625 and 8   HHof 4  Pet dog.    No current outpatient prescriptions on file.  EXAMCeasar Mons:  Filed Vitals:   11/27/14 1358  BP: 102/68  Pulse: 86  Temp: 97.4 F (36.3 C)    Body mass index is 22.96 kg/(m^2).  GENERAL: vitals reviewed and listed above, alert, oriented, appears well hydrated and in no acute distress  HEENT: atraumatic, conjunttiva clear, no obvious abnormalities on inspection of external nose and ears, normal appearance of ear canals and TMs, clear nasal congestion, mild post  oropharyngeal erythema with PND, no tonsillar edema or exudate, no sinus TTP  NECK: no obvious masses on inspection  LUNGS: clear to auscultation bilaterally, no wheezes, rales or rhonchi, good air movement  CV: HRRR, no peripheral edema  MS: moves all extremities without noticeable abnormality  PSYCH: pleasant and cooperative, no obvious depression or anxiety  ASSESSMENT AND PLAN:  Discussed the following assessment and plan:  Rhinosinusitis  -given HPI and exam findings today, a serious infection or illness is unlikely. We discussed potential etiologies, with AR or VURI being most likely, and advised zyrtec, INS, supportive care and monitoring. We discussed treatment side effects, likely course, antibiotic misuse, transmission, and signs of developing a serious illness.  -of course, we advised to return or notify a doctor immediately if symptoms worsen or persist or new concerns arise.    There are no Patient Instructions on file for this visit.   Kriste BasqueKIM, HANNAH R.

## 2014-11-27 NOTE — Telephone Encounter (Signed)
Pt needs refill on fluticasone nasal spray [facility]

## 2014-11-27 NOTE — Telephone Encounter (Signed)
Not filled since 2014

## 2014-11-29 ENCOUNTER — Encounter: Payer: BC Managed Care – PPO | Admitting: Internal Medicine

## 2015-02-20 ENCOUNTER — Encounter: Payer: Self-pay | Admitting: Internal Medicine

## 2015-02-20 ENCOUNTER — Ambulatory Visit (INDEPENDENT_AMBULATORY_CARE_PROVIDER_SITE_OTHER): Payer: BLUE CROSS/BLUE SHIELD | Admitting: Internal Medicine

## 2015-02-20 VITALS — BP 138/82 | Temp 97.8°F | Wt 143.0 lb

## 2015-02-20 DIAGNOSIS — J029 Acute pharyngitis, unspecified: Secondary | ICD-10-CM | POA: Diagnosis not present

## 2015-02-20 DIAGNOSIS — B9789 Other viral agents as the cause of diseases classified elsewhere: Secondary | ICD-10-CM

## 2015-02-20 DIAGNOSIS — B349 Viral infection, unspecified: Secondary | ICD-10-CM

## 2015-02-20 DIAGNOSIS — J988 Other specified respiratory disorders: Principal | ICD-10-CM

## 2015-02-20 NOTE — Patient Instructions (Signed)
This acts viral  Flu like  No work if still  Having fever .  Cough congestion may last  For week or more but you should feel better .   If throat gets worse   Strep   Contact us .

## 2015-02-20 NOTE — Progress Notes (Signed)
Pre visit review using our clinic review tool, if applicable. No additional management support is needed unless otherwise documented below in the visit note.  Chief Complaint  Patient presents with  . Fever  . Nasal Congestion  . Sore Throat  . Headache  . Generalized Body Aches  . Chills    HPI: Patient Cheryl Petersen  comes in today for SDA for  new problem evaluation. Onset   ;ow grade fever   2 weeks congestion nose  Zyrtec. D now broke at night.    Now sore throaty this am better noiw.  Little cough  And headache ache.  Fever  3 days ago malaise .  100 low grade yesterday .  Achy  Using ibuprofen . 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 Allergic rhinitis   . Multinodular goiter (nontoxic)     benign bx in 2013  last [place] 2015 stable    Family History  Problem Relation Age of Onset  . Healthy Mother   . Hypertension Father   . Hyperlipidemia Father   . Cancer Maternal Grandmother     skin  . Cancer Maternal Grandfather     skin    Social History   Social History  . Marital Status: Married    Spouse Name: N/A  . Number of Children: N/A  . Years of Education: N/A   Social History Main Topics  . Smoking status: Never Smoker   . Smokeless tobacco: Never Used  . Alcohol Use: Yes     Comment: OCC  . Drug Use: No  . Sexual Activity: Yes   Other Topics Concern  . 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    Two children age 59 and 8   HHof 4  Pet dog.    Outpatient Prescriptions Prior to Visit  Medication Sig Dispense Refill  . fluticasone (FLONASE) 50 MCG/ACT nasal spray Place 2 sprays into both nostrils daily. 16 g 11   No  facility-administered medications prior to visit.     EXAM:  BP 138/82 mmHg  Temp(Src) 97.8 F (36.6 C) (Oral)  Wt 143 lb (64.864 kg)  Body mass index is 22.74 kg/(m^2). WDWN in NAD  quiet respirations; mildly congested  . Non toxic . HEENT: Normocephalic ;atraumatic , Eyes;  PERRL, EOMs  Full, lids and conjunctiva clear,,Ears: no deformities, canals nl, TM landmarks normalold scar left , Nose: no deformity or discharge but congested;face non tender Mouth : OP clear without lesion or edema .  Red 1+ no edema    Neck: Supple without adenopathy or masses or bruits Chest:  Clear to A without wheezes rales or rhonchi CV:  S1-S2 no gallops or murmurs peripheral perfusion is normal Skin :nl perfusion and no acute rashes  PSYCH: pleasant and cooperative, no obvious depression or anxiety  ASSESSMENT AND PLAN:  Discussed the following assessment and plan:  Viral respiratory illness - dis strep test acts more viral and throat feels better also today   Sore throat   Expectant management.  If gets sx of strep contact us  Or if fever alarm sx  Immunized  flu   High flu exposures  -Patient advised to return or notify health care team  if symptoms worsen ,persist or new concerns arise.  Patient Instructions  This acts viral  Flu like  No work if still  Having fever .  Cough congestion may last  For week or more but you should feel better .   If throat gets worse   Strep   Contact us .     Neta Mends. Panosh M.D.

## 2015-07-19 ENCOUNTER — Encounter: Payer: Self-pay | Admitting: Anesthesiology

## 2015-08-03 ENCOUNTER — Encounter: Payer: Self-pay | Admitting: Gynecology

## 2015-08-27 ENCOUNTER — Encounter: Payer: Self-pay | Admitting: Gynecology

## 2015-08-27 ENCOUNTER — Ambulatory Visit (INDEPENDENT_AMBULATORY_CARE_PROVIDER_SITE_OTHER): Payer: BLUE CROSS/BLUE SHIELD | Admitting: Gynecology

## 2015-08-27 ENCOUNTER — Telehealth: Payer: Self-pay | Admitting: *Deleted

## 2015-08-27 VITALS — BP 122/78 | Ht 66.5 in | Wt 146.0 lb

## 2015-08-27 DIAGNOSIS — E049 Nontoxic goiter, unspecified: Secondary | ICD-10-CM

## 2015-08-27 DIAGNOSIS — E041 Nontoxic single thyroid nodule: Secondary | ICD-10-CM

## 2015-08-27 DIAGNOSIS — Z01419 Encounter for gynecological examination (general) (routine) without abnormal findings: Secondary | ICD-10-CM

## 2015-08-27 NOTE — Telephone Encounter (Signed)
-----   Message from Ok EdwardsJuan H Fernandez, MD sent at 08/27/2015 12:13 PM EDT ----- Patient with history of multinoduar goiter . Need thyroid scan. Last study 2 years ago

## 2015-08-27 NOTE — Progress Notes (Signed)
Cheryl Petersen 09/06/1965 [id number]   History:    50 y.o.  for annual gyn exam with no complaints today. Review of patient's record indicated she's had history of multinodular goiter in the past and her PCP had done a TSH last year which was normal and her last thyroid scan was back in 2015. Patient reports no weight changes or any temperature instability. Back in 2011 she had a NovaSure endometrial ablation and reports normal menstrual cycles occasionally of spotting. Patient with no previous history of any abnormal Pap smears.  Mammogram last year. She had a three-dimensional mammogram as a result of a right breast cyst and was followed up with an ultrasound appeared to be a small simple cyst recommend follow-up in one year.  Past medical history,surgical history, family history and social history were all reviewed and documented in the EPIC chart.  Gynecologic History No LMP recorded. Patient has had an ablation. Contraception: vasectomy Last Pap: 2013 2016. Results were: normal Last mammogram: 2017. Results were: See above  Obstetric History OB History  Gravida Para Term Preterm AB Living  3 2 2   1 2   SAB TAB Ectopic Multiple Live Births  1       2    # Outcome Date GA Lbr Len/2nd Weight Sex Delivery Anes PTL Lv  3 SAB           2 Term     M CS-Unspec  N LIV  1 Term     M Vag-Spont  N LIV       ROS: A ROS was performed and pertinent positives and negatives are included in the history.  GENERAL: No fevers or chills. HEENT: No change in vision, no earache, sore throat or sinus congestion. NECK: No pain or stiffness. CARDIOVASCULAR: No chest pain or pressure. No palpitations. PULMONARY: No shortness of breath, cough or wheeze. GASTROINTESTINAL: No abdominal pain, nausea, vomiting or diarrhea, melena or bright red blood per rectum. GENITOURINARY: No urinary frequency, urgency, hesitancy or dysuria. MUSCULOSKELETAL: No joint or muscle pain, no back pain, no recent trauma.  DERMATOLOGIC: No rash, no itching, no lesions. ENDOCRINE: No polyuria, polydipsia, no heat or cold intolerance. No recent change in weight. HEMATOLOGICAL: No anemia or easy bruising or bleeding. NEUROLOGIC: No headache, seizures, numbness, tingling or weakness. PSYCHIATRIC: No depression, no loss of interest in normal activity or change in sleep pattern.     Exam: chaperone present  BP 122/78   Ht 5' 6.5" (1.689 m)   Wt 146 lb (66.2 kg)   BMI 23.21 kg/m   Body mass index is 23.21 kg/m.  General appearance : Well developed well nourished female. No acute distress HEENT: Eyes: no retinal hemorrhage or exudates,  Neck supple, trachea midline, no carotid bruits, no thyroidmegaly Lungs: Clear to auscultation, no rhonchi or wheezes, or rib retractions  Heart: Regular rate and rhythm, no murmurs or gallops Breast:Examined in sitting and supine position were symmetrical in appearance, no palpable masses or tenderness,  no skin retraction, no nipple inversion, no nipple discharge, no skin discoloration, no axillary or supraclavicular lymphadenopathy Abdomen: no palpable masses or tenderness, no rebound or guarding Extremities: no edema or skin discoloration or tenderness  Pelvic:  Bartholin, Urethra, Skene Glands: Within normal limits             Vagina: No gross lesions or discharge  Cervix: No gross lesions or discharge  Uterus  anteverted, normal size, shape and consistency, non-tender and mobile  Adnexa  Without masses or tenderness  Anus and perineum  normal   Rectovaginal  normal sphincter tone without palpated masses or tenderness             Hemoccult not indicated     Assessment/Plan:  50 y.o. female for annual exam with past history of multinodular goiter and past history of right thyroid nodule biopsy benign in 2013 we'll schedule a thyroid scan for follow-up this year. She'll return to the office next week in a fasting state for the following blood were: Comprehensive metabolic  panel, fasting lipid profile, thyroid panel, CBC, and urinalysis. Pap smear not indicated this year. We discussed importance of calcium vitamin D and weightbearing exercises for osteoporosis prevention. Next week when she comes her blood work she'll receive her flu vaccine.   Ok EdwardsFERNANDEZ,Bernal Luhman H MD, 12:40 PM 08/27/2015

## 2015-08-27 NOTE — Telephone Encounter (Signed)
Appointment on 08/29/15 [facility]am [facility], left message for pt to call

## 2015-08-28 LAB — URINALYSIS W MICROSCOPIC + REFLEX CULTURE
BACTERIA UA: NONE SEEN [HPF]
Bilirubin Urine: NEGATIVE
CRYSTALS: NONE SEEN [HPF]
Casts: NONE SEEN [LPF]
GLUCOSE, UA: NEGATIVE
Hgb urine dipstick: NEGATIVE
KETONES UR: NEGATIVE
Leukocytes, UA: NEGATIVE
Nitrite: NEGATIVE
PH: 6.5 (ref 5.0–8.0)
Protein, ur: NEGATIVE
Specific Gravity, Urine: 1.008 (ref 1.001–1.035)
WBC, UA: NONE SEEN WBC/HPF (ref <=5)
YEAST: NONE SEEN [HPF]

## 2015-08-28 NOTE — Telephone Encounter (Signed)
Pt called and rescheduled for 09/04/15

## 2015-08-29 ENCOUNTER — Ambulatory Visit (HOSPITAL_COMMUNITY): Payer: BLUE CROSS/BLUE SHIELD

## 2015-08-29 LAB — URINE CULTURE: Organism ID, Bacteria: NO GROWTH

## 2015-09-04 ENCOUNTER — Ambulatory Visit (HOSPITAL_COMMUNITY)
Admission: RE | Admit: 2015-09-04 | Discharge: 2015-09-04 | Disposition: A | Discharge status: Home / Self Care | Payer: BLUE CROSS/BLUE SHIELD | Source: Ambulatory Visit | Attending: Gynecology | Admitting: Gynecology

## 2015-09-04 DIAGNOSIS — E049 Nontoxic goiter, unspecified: Secondary | ICD-10-CM | POA: Insufficient documentation

## 2015-09-13 ENCOUNTER — Other Ambulatory Visit: Payer: BLUE CROSS/BLUE SHIELD

## 2015-09-13 LAB — COMPREHENSIVE METABOLIC PANEL
ALK PHOS: 51 U/L (ref 33–115)
ALT: 11 U/L (ref 6–29)
AST: 17 U/L (ref 10–35)
Albumin: 4.5 g/dL (ref 3.6–5.1)
BUN: 11 mg/dL (ref 7–25)
CALCIUM: 9.3 mg/dL (ref 8.6–10.2)
CO2: 27 mmol/L (ref 20–31)
Chloride: 105 mmol/L (ref 98–110)
Creat: 0.88 mg/dL (ref 0.50–1.10)
GLUCOSE: 92 mg/dL (ref 65–99)
POTASSIUM: 4.4 mmol/L (ref 3.5–5.3)
Sodium: 137 mmol/L (ref 135–146)
Total Bilirubin: 0.6 mg/dL (ref 0.2–1.2)
Total Protein: 7.1 g/dL (ref 6.1–8.1)

## 2015-09-13 LAB — CBC WITH DIFFERENTIAL/PLATELET
BASOS ABS: 0 {cells}/uL (ref 0–200)
Basophils Relative: 0 %
EOS ABS: 92 {cells}/uL (ref 15–500)
Eosinophils Relative: 2 %
HEMATOCRIT: 43.1 % (ref 35.0–45.0)
Hemoglobin: 14.8 g/dL (ref 11.7–15.5)
LYMPHS ABS: 1150 {cells}/uL (ref 850–3900)
Lymphocytes Relative: 25 %
MCH: 30.9 pg (ref 27.0–33.0)
MCHC: 34.3 g/dL (ref 32.0–36.0)
MCV: 90 fL (ref 80.0–100.0)
MONO ABS: 460 {cells}/uL (ref 200–950)
MPV: 10.1 fL (ref 7.5–12.5)
Monocytes Relative: 10 %
NEUTROS ABS: 2898 {cells}/uL (ref 1500–7800)
Neutrophils Relative %: 63 %
Platelets: 303 10*3/uL (ref 140–400)
RBC: 4.79 MIL/uL (ref 3.80–5.10)
RDW: 12.5 % (ref 11.0–15.0)
WBC: 4.6 10*3/uL (ref 3.8–10.8)

## 2015-09-13 LAB — LIPID PANEL
Cholesterol: 192 mg/dL (ref 125–200)
HDL: 71 mg/dL (ref >=46)
LDL Cholesterol: 109 mg/dL (ref <130)
Total CHOL/HDL Ratio: 2.7 Ratio (ref <=5.0)
Triglycerides: 60 mg/dL (ref <150)
VLDL: 12 mg/dL (ref <30)

## 2015-09-14 LAB — THYROID PANEL WITH TSH
FREE THYROXINE INDEX: 2.6 (ref 1.4–3.8)
T3 Uptake: 34 % (ref 22–35)
T4 TOTAL: 7.6 ug/dL (ref 4.5–12.0)
TSH: 1.27 m[IU]/L

## 2016-05-21 ENCOUNTER — Encounter: Payer: Self-pay | Admitting: Gynecology

## 2016-09-26 ENCOUNTER — Encounter: Payer: Self-pay | Admitting: Internal Medicine

## 2017-01-05 NOTE — Progress Notes (Signed)
Chief Complaint  Patient presents with  . Annual Exam    Denies any new concerns.     HPI: Patient  Cheryl Petersen  51 y.o. comes in today for Preventive Health Care visit   Coughing illness still has mild cough   righ ear clogged off and on hx of same and offered pe tube  Didn't  Proceed  Sees derm  Yearly skin check  Took allegra   Needs refill flonse for seasonal sx   Health Maintenance  Topic Date Due  . HIV Screening  03/12/1982  . TETANUS/TDAP  12/10/2015  . PAP SMEAR  08/22/2017  . 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 GI /GU: No adominal pain, vomiting, change in bowel habits. No blood in the stool. No significant GU symptoms. SKIN/HEME: ,no acute skin rashes suspicious lesions or bleeding. No lymphadenopathy, nodules, masses.  NEURO/ PSYCH:  No neurologic signs such as weakness numbness. No depression anxiety. IMM/ Allergy: No unusual infections.  Allergy .   REST of 12 system review negative except as per HPI   Past Medical History:  Diagnosis Date  . Allergic rhinitis   . Multinodular goiter (nontoxic)    benign bx in 2013  last [place] 2015 stable  . 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 CESAREAN SECTION    . ENDOMETRIAL ABLATION     2011  . NO PAST SURGERIES      Family History  Problem Relation Age of Onset  . Healthy Mother   . Hypertension Father   . Hyperlipidemia Father   . Cancer Maternal Grandmother        skin  . Cancer Maternal Grandfather         skin    Social History   Socioeconomic History  . Marital status: Married    Spouse name: None  . Number of children: None  . Years of education: None  . Highest education level: None  Social Needs  . Financial resource strain: None  . Food insecurity - worry: None  . Food insecurity - inability: None  . Transportation needs - medical: None  . Transportation needs - non-medical: None  Occupational History  . None  Tobacco Use  . Smoking status: Never Smoker  . Smokeless tobacco: Never Used  Substance and Sexual Activity  . Alcohol use: Yes    Comment: OCC  . Drug use: No  . Sexual activity: Yes  Other Topics Concern  . 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    Two children age 31 and 60   HHof 4  Pet dog.    Outpatient Medications Prior to Visit  Medication Sig Dispense Refill  . fluticasone (FLONASE) 50 MCG/ACT nasal spray Place 2 sprays into both nostrils daily. 16 g 11   No facility-administered medications prior to visit.      EXAM:  BP 122/70 (BP Location: Left Arm, Patient  Position: Sitting, Cuff Size: Normal)   Pulse 62   Temp 98.2 F (36.8 C) (Oral)   Ht 5' 5.75" (1.67 m)   Wt 146 lb 3.2 oz (66.3 kg)   BMI 23.78 kg/m   Body mass index is 23.78 kg/m. Wt Readings from Last 3 Encounters:  01/07/17 146 lb 3.2 oz (66.3 kg)  08/27/15 146 lb (66.2 kg)  02/20/15 143 lb (64.9 kg)    Physical Exam: Vital signs reviewed EBR:AXEN is a well-developed well-nourished alert cooperative    who appearsr stated age in no acute distress.  HEENT: normocephalic atraumatic , Eyes: PERRL EOM's full, conjunctiva clear, Nares: paten,t no deformity discharge or tenderness., Ears: no deformity EAC's clear 1 + wasx in right but can see light reflex on tim   bnl  TMs with normal landmarks. Mouth: clear OP, no lesions, edema.  Moist mucous membranes. Dentition in adequate repair. NECK: supple without  masses, palpable  thryoid no nodules feltor . CHEST/PULM:  Clear to auscultation and percussion breath sounds equal no wheeze , rales or rhonchi. No chest wall deformities or tenderness. Breast: normal by inspection . No dimpling, discharge, masses, tenderness or discharge . CV: PMI is nondisplaced, S1 S2 no gallops, murmurs, rubs. Peripheral pulses are full without delay.No JVD .  ABDOMEN: Bowel sounds normal nontender  No guard or rebound, no hepato splenomegal no CVA tenderness.  No hernia. Extremtities:  No clubbing cyanosis or edema, no acute joint swelling or redness no focal atrophy NEURO:  Oriented x3, cranial nerves 3-12 appear to be intact, no obvious focal weakness,gait within normal limits no abnormal reflexes or asymmetrical SKIN: No acute rashes normal turgor, color, no bruising or petechiae. PSYCH: Oriented, good eye contact, no obvious depression anxiety, cognition and judgment appear normal. LN: no cervical axillaryadenopathy  Lab Results  Component Value Date   WBC 4.6 09/13/2015   HGB 14.8 09/13/2015   HCT 43.1 09/13/2015   PLT 303 09/13/2015   GLUCOSE 92 09/13/2015   CHOL 192 09/13/2015   TRIG 60 09/13/2015   HDL 71 09/13/2015   LDLCALC 109 09/13/2015   ALT 11 09/13/2015   AST 17 09/13/2015   NA 137 09/13/2015   K 4.4 09/13/2015   CL 105 09/13/2015   CREATININE 0.88 09/13/2015   BUN 11 09/13/2015   CO2 27 09/13/2015   TSH 1.27 09/13/2015    BP Readings from Last 3 Encounters:  01/07/17 122/70  08/27/15 122/78  02/20/15 138/82      ASSESSMENT AND PLAN:  Discussed the following assessment and plan:  Visit for preventive health examination - Plan: Basic metabolic panel, CBC with Differential/Platelet, Hepatic function panel, Lipid panel, TSH  Multinodular goiter (nontoxic) - Plan: Basic metabolic panel, CBC with Differential/Platelet, Hepatic function panel, Lipid panel, TSH  Dysfunction of right eustachian tube - Plan: Basic metabolic panel, CBC with  Differential/Platelet, Hepatic function panel, Lipid panel, TSH  Cough, persistent - Plan: Basic metabolic panel, CBC with Differential/Platelet, Hepatic function panel, Lipid panel, TSH  Need for tetanus booster - Plan: Td : Tetanus/diphtheria >7yo Preservative  free Nonfasting blood work today expectant management in regard to cough and upper respiratory symptoms exam is reassuring parameters for return or follow-up discussed. Refill flonase  Form signed for  Insurance a nd work  Patient Care Team: Shelena Castelluccio, Standley Brooking, MD as PCP - General (Internal Medicine) Danella Sensing, MD (Dermatology) gyne Terrance Mass, MD as Consulting Physician (Gynecology) Patient Instructions  Will notify you  of labs when available.  Begin flonase  If ongoing problems cough ear etc   Contact us for recheck . prob post infectious irritable   Airway  And congestion. Colon cancer screen   age 60 and above    Preventive Care 53-64 Years, Female Preventive care refers to lifestyle choices and visits with your health care provider that can promote health and wellness. What does preventive care include?  A yearly physical exam. This is also called an annual well check.  Dental exams once or twice a year.  Routine eye exams. Ask your health care provider how often you should have your eyes checked.  Personal lifestyle choices, including: ? Daily care of your teeth and gums. ? Regular physical activity. ? Eating a healthy diet. ? Avoiding tobacco and drug use. ? Limiting alcohol use. ? Practicing safe sex. ? Taking low-dose aspirin daily starting at age 20. ? Taking vitamin and mineral supplements as recommended by your health care provider. What happens during an annual well check? The services and screenings done by your health care provider during your annual well check will depend on your age, overall health, lifestyle risk factors, and family history of disease. Counseling Your health care  provider may ask you questions about your:  Alcohol use.  Tobacco use.  Drug use.  Emotional well-being.  Home and relationship well-being.  Sexual activity.  Eating habits.  Work and work Statistician.  Method of birth control.  Menstrual cycle.  Pregnancy history.  Screening You may have the following tests or measurements:  Height, weight, and BMI.  Blood pressure.  Lipid and cholesterol levels. These may be checked every 5 years, or more frequently if you are over 4 years old.  Skin check.  Lung cancer screening. You may have this screening every year starting at age 23 if you have a 30-pack-year history of smoking and currently smoke or have quit within the past 15 years.  Fecal occult blood test (FOBT) of the stool. You may have this test every year starting at age 59.  Flexible sigmoidoscopy or colonoscopy. You may have a sigmoidoscopy every 5 years or a colonoscopy every 10 years starting at age 65.  Hepatitis C blood test.  Hepatitis B blood test.  Sexually transmitted disease (STD) testing.  Diabetes screening. This is done by checking your blood sugar (glucose) after you have not eaten for a while (fasting). You may have this done every 1-3 years.  Mammogram. This may be done every 1-2 years. Talk to your health care provider about when you should start having regular mammograms. This may depend on whether you have a family history of breast cancer.  BRCA-related cancer screening. This may be done if you have a family history of breast, ovarian, tubal, or peritoneal cancers.  Pelvic exam and Pap test. This may be done every 3 years starting at age 67. Starting at age 65, this may be done every 5 years if you have a Pap test in combination with an HPV test.  Bone density scan. This is done to screen for osteoporosis. You may have this scan if you are at high risk for osteoporosis.  Discuss your test results, treatment options, and if necessary, the  need for more tests with your health care provider. Vaccines Your health care provider may recommend certain vaccines, such as:  Influenza vaccine. This is recommended every year.  Tetanus, diphtheria, and acellular pertussis (Tdap, Td) vaccine. You may need a Td booster every 10 years.  Varicella vaccine. You may  need this if you have not been vaccinated.  Zoster vaccine. You may need this after age 68.  Measles, mumps, and rubella (MMR) vaccine. You may need at least one dose of MMR if you were born in 1957 or later. You may also need a second dose.  Pneumococcal 13-valent conjugate (PCV13) vaccine. You may need this if you have certain conditions and were not previously vaccinated.  Pneumococcal polysaccharide (PPSV23) vaccine. You may need one or two doses if you smoke cigarettes or if you have certain conditions.  Meningococcal vaccine. You may need this if you have certain conditions.  Hepatitis A vaccine. You may need this if you have certain conditions or if you travel or work in places where you may be exposed to hepatitis A.  Hepatitis B vaccine. You may need this if you have certain conditions or if you travel or work in places where you may be exposed to hepatitis B.  Haemophilus influenzae type b (Hib) vaccine. You may need this if you have certain conditions.  Talk to your health care provider about which screenings and vaccines you need and how often you need them. This information is not intended to replace advice given to you by your health care provider. Make sure you discuss any questions you have with your health care provider. Document Released: 01/19/2015 Document Revised: 09/12/2015 Document Reviewed: 10/24/2014 Elsevier Interactive Patient Education  2018 [place]. Develle Sievers M.D.

## 2017-01-07 ENCOUNTER — Encounter: Payer: Self-pay | Admitting: Internal Medicine

## 2017-01-07 ENCOUNTER — Ambulatory Visit (INDEPENDENT_AMBULATORY_CARE_PROVIDER_SITE_OTHER): Payer: Managed Care, Other (non HMO) | Admitting: Internal Medicine

## 2017-01-07 VITALS — BP 122/70 | HR 62 | Temp 98.2°F | Ht 65.75 in | Wt 146.2 lb

## 2017-01-07 DIAGNOSIS — R053 Chronic cough: Secondary | ICD-10-CM

## 2017-01-07 DIAGNOSIS — Z0000 Encounter for general adult medical examination without abnormal findings: Secondary | ICD-10-CM

## 2017-01-07 DIAGNOSIS — H6981 Other specified disorders of Eustachian tube, right ear: Secondary | ICD-10-CM | POA: Diagnosis not present

## 2017-01-07 DIAGNOSIS — R05 Cough: Secondary | ICD-10-CM

## 2017-01-07 DIAGNOSIS — E042 Nontoxic multinodular goiter: Secondary | ICD-10-CM | POA: Diagnosis not present

## 2017-01-07 DIAGNOSIS — Z23 Encounter for immunization: Secondary | ICD-10-CM | POA: Diagnosis not present

## 2017-01-07 DIAGNOSIS — Z0001 Encounter for general adult medical examination with abnormal findings: Secondary | ICD-10-CM | POA: Diagnosis not present

## 2017-01-07 MED ORDER — FLUTICASONE PROPIONATE 50 MCG/ACT NA SUSP: 2.0000 | Freq: Every day | NASAL | 11 refills | Status: DC

## 2017-01-07 NOTE — Patient Instructions (Addendum)
Will notify you  of labs when available.   Begin flonase  If ongoing problems cough ear etc   Contact us for recheck . prob post infectious irritable   Airway  And congestion. Colon cancer screen   age 50 and above    Preventive Care 38-64 Years, Female Preventive care refers to lifestyle choices and visits with your health care provider that can promote health and wellness. What does preventive care include?  A yearly physical exam. This is also called an annual well check.  Dental exams once or twice a year.  Routine eye exams. Ask your health care provider how often you should have your eyes checked.  Personal lifestyle choices, including: ? Daily care of your teeth and gums. ? Regular physical activity. ? Eating a healthy diet. ? Avoiding tobacco and drug use. ? Limiting alcohol use. ? Practicing safe sex. ? Taking low-dose aspirin daily starting at age 63. ? Taking vitamin and mineral supplements as recommended by your health care provider. What happens during an annual well check? The services and screenings done by your health care provider during your annual well check will depend on your age, overall health, lifestyle risk factors, and family history of disease. Counseling Your health care provider may ask you questions about your:  Alcohol use.  Tobacco use.  Drug use.  Emotional well-being.  Home and relationship well-being.  Sexual activity.  Eating habits.  Work and work Statistician.  Method of birth control.  Menstrual cycle.  Pregnancy history.  Screening You may have the following tests or measurements:  Height, weight, and BMI.  Blood pressure.  Lipid and cholesterol levels. These may be checked every 5 years, or more frequently if you are over 27 years old.  Skin check.  Lung cancer screening. You may have this screening every year starting at age 64 if you have a 30-pack-year history of smoking and currently smoke or have quit  within the past 15 years.  Fecal occult blood test (FOBT) of the stool. You may have this test every year starting at age 51.  Flexible sigmoidoscopy or colonoscopy. You may have a sigmoidoscopy every 5 years or a colonoscopy every 10 years starting at age 57.  Hepatitis C blood test.  Hepatitis B blood test.  Sexually transmitted disease (STD) testing.  Diabetes screening. This is done by checking your blood sugar (glucose) after you have not eaten for a while (fasting). You may have this done every 1-3 years.  Mammogram. This may be done every 1-2 years. Talk to your health care provider about when you should start having regular mammograms. This may depend on whether you have a family history of breast cancer.  BRCA-related cancer screening. This may be done if you have a family history of breast, ovarian, tubal, or peritoneal cancers.  Pelvic exam and Pap test. This may be done every 3 years starting at age 101. Starting at age 33, this may be done every 5 years if you have a Pap test in combination with an HPV test.  Bone density scan. This is done to screen for osteoporosis. You may have this scan if you are at high risk for osteoporosis.  Discuss your test results, treatment options, and if necessary, the need for more tests with your health care provider. Vaccines Your health care provider may recommend certain vaccines, such as:  Influenza vaccine. This is recommended every year.  Tetanus, diphtheria, and acellular pertussis (Tdap, Td) vaccine. You may need a Td  booster every 10 years.  Varicella vaccine. You may need this if you have not been vaccinated.  Zoster vaccine. You may need this after age 58.  Measles, mumps, and rubella (MMR) vaccine. You may need at least one dose of MMR if you were born in 1957 or later. You may also need a second dose.  Pneumococcal 13-valent conjugate (PCV13) vaccine. You may need this if you have certain conditions and were not previously  vaccinated.  Pneumococcal polysaccharide (PPSV23) vaccine. You may need one or two doses if you smoke cigarettes or if you have certain conditions.  Meningococcal vaccine. You may need this if you have certain conditions.  Hepatitis A vaccine. You may need this if you have certain conditions or if you travel or work in places where you may be exposed to hepatitis A.  Hepatitis B vaccine. You may need this if you have certain conditions or if you travel or work in places where you may be exposed to hepatitis B.  Haemophilus influenzae type b (Hib) vaccine. You may need this if you have certain conditions.  Talk to your health care provider about which screenings and vaccines you need and how often you need them. This information is not intended to replace advice given to you by your health care provider. Make sure you discuss any questions you have with your health care provider. Document Released: 01/19/2015 Document Revised: 09/12/2015 Document Reviewed: 10/24/2014 Elsevier Interactive Patient Education  [employer].

## 2017-01-08 LAB — LIPID PANEL
CHOL/HDL RATIO: 3
CHOLESTEROL: 192 mg/dL (ref 0–200)
HDL: 59.5 mg/dL (ref >39.00)
LDL Cholesterol: 112 mg/dL — ABNORMAL HIGH (ref 0–99)
NonHDL: 132.33
TRIGLYCERIDES: 102 mg/dL (ref 0.0–149.0)
VLDL: 20.4 mg/dL (ref 0.0–40.0)

## 2017-01-08 LAB — CBC WITH DIFFERENTIAL/PLATELET
BASOS PCT: 0.8 % (ref 0.0–3.0)
Basophils Absolute: 0 10*3/uL (ref 0.0–0.1)
Eosinophils Absolute: 0.1 10*3/uL (ref 0.0–0.7)
Eosinophils Relative: 1.4 % (ref 0.0–5.0)
HEMATOCRIT: 43.6 % (ref 36.0–46.0)
Hemoglobin: 14.4 g/dL (ref 12.0–15.0)
LYMPHS PCT: 26.2 % (ref 12.0–46.0)
Lymphs Abs: 1.3 10*3/uL (ref 0.7–4.0)
MCHC: 32.9 g/dL (ref 30.0–36.0)
MCV: 92.1 fl (ref 78.0–100.0)
MONO ABS: 0.4 10*3/uL (ref 0.1–1.0)
MONOS PCT: 9.1 % (ref 3.0–12.0)
Neutro Abs: 3.1 10*3/uL (ref 1.4–7.7)
Neutrophils Relative %: 62.5 % (ref 43.0–77.0)
Platelets: 294 10*3/uL (ref 150.0–400.0)
RBC: 4.73 Mil/uL (ref 3.87–5.11)
RDW: 12.1 % (ref 11.5–15.5)
WBC: 4.9 10*3/uL (ref 4.0–10.5)

## 2017-01-08 LAB — HEPATIC FUNCTION PANEL
ALT: 15 U/L (ref 0–35)
AST: 16 U/L (ref 0–37)
Albumin: 4.4 g/dL (ref 3.5–5.2)
Alkaline Phosphatase: 62 U/L (ref 39–117)
BILIRUBIN TOTAL: 0.3 mg/dL (ref 0.2–1.2)
Bilirubin, Direct: 0.1 mg/dL (ref 0.0–0.3)
TOTAL PROTEIN: 6.9 g/dL (ref 6.0–8.3)

## 2017-01-08 LAB — BASIC METABOLIC PANEL
BUN: 16 mg/dL (ref 6–23)
CALCIUM: 9.2 mg/dL (ref 8.4–10.5)
CHLORIDE: 103 meq/L (ref 96–112)
CO2: 31 mEq/L (ref 19–32)
CREATININE: 0.85 mg/dL (ref 0.40–1.20)
GFR: 75.3 mL/min (ref >60.00)
Glucose, Bld: 89 mg/dL (ref 70–99)
Potassium: 4.4 mEq/L (ref 3.5–5.1)
Sodium: 141 mEq/L (ref 135–145)

## 2017-01-08 LAB — TSH: TSH: 1.15 u[IU]/mL (ref 0.35–4.50)

## 2017-01-14 ENCOUNTER — Ambulatory Visit: Payer: Managed Care, Other (non HMO) | Admitting: Internal Medicine

## 2017-01-14 ENCOUNTER — Encounter: Payer: Self-pay | Admitting: Internal Medicine

## 2017-01-14 VITALS — BP 96/70 | HR 59 | Temp 98.3°F | Wt 146.0 lb

## 2017-01-14 DIAGNOSIS — K130 Diseases of lips: Secondary | ICD-10-CM

## 2017-01-14 DIAGNOSIS — R6889 Other general symptoms and signs: Secondary | ICD-10-CM

## 2017-01-14 NOTE — Patient Instructions (Addendum)
We may never know what started this irritationon the lips area  But  At this time  moisturization  Of the area is indeed the key to this.   NO medication   In the  Lip balms     Lip Vaseline .    No  Scent or menthol .,  Then can use an anti fungal  such as monistat  clotrimazole or miconazole at   At the corner es of the mouth     To treat  What looks like a  Cheilitis   Minimize any chemical exposures until heals    No evidence of deep infections   .  May take 1-2 weeks to totally heal

## 2017-01-14 NOTE — Progress Notes (Signed)
Chief Complaint  Patient presents with  . Oral Swelling    Pt c/o lip swelling top lip, corners of mouth and under bottom lip. Possible allergic reaction to something. Pt has been using vasoline at night and she will wake up with it even more swollen. Pt is havin gto use Blistex throughout the day for pain relief.  No new foods, make up, faical products or detergent.     HPI: Cheryl Petersen 52 y.o. sda  Progressing   With lip  stinging and irritation  Has used Vaseline aquafor   blistex   saw dentist   Who though it may have been  Contact derm or  Other  No new makeup  One new facial wash.    No fever  Some  burning at corners of mouth ROS: See pertinent positives and negatives per HPI.  Past Medical History:  Diagnosis Date  . Allergic rhinitis   . Multinodular goiter (nontoxic)    benign bx in 2013  last [place] 2015 stable  . 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 Healthy Mother   . Hypertension Father   . Hyperlipidemia Father   . Cancer Maternal Grandmother        skin  . Cancer Maternal Grandfather        skin    Social History   Socioeconomic History  . Marital status: Married    Spouse name: None  . Number of children: None  . Years of education: None  . Highest education level: None  Social Needs  . Financial resource strain: None  . Food insecurity - worry: None  . Food insecurity - inability: None  . Transportation needs - medical: None  . Transportation needs - non-medical: None  Occupational History  . None  Tobacco Use  . Smoking status: Never Smoker  . Smokeless tobacco: Never Used  Substance and Sexual Activity  . Alcohol use: Yes    Comment: OCC  . Drug use: No  . Sexual activity: Yes  Other Topics Concern  . 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    Two  children age 595 and 8   HHof 4  Pet dog.    Outpatient Medications Prior to Visit  Medication Sig Dispense Refill  . fluticasone (FLONASE) 50 MCG/ACT nasal spray Place 2 sprays into both nostrils daily. 16 g 11   No facility-administered medications prior to visit.      EXAM:  BP 96/70 (BP Location: Right Arm, Patient Position: Sitting, Cuff Size: Normal)   Pulse (!) 59   Temp 98.3 F (36.8 C) (Oral)   Wt 146 lb (66.2 kg)   BMI 23.74 kg/m   Body mass index is 23.74 kg/m.  GENERAL: vitals reviewed and listed above, alert, oriented, appears well hydrated and in no acute distress HEENT: atraumatic, conjunctiva  clear, no obvious abnormalities on inspection of external nose and ears   Peri lip line of redness with slight  Cheilitis at angle of mouth    No lesion  no blisters OP : no lesion edema or exudate  NECK: no obvious masses on inspection palpation   ASSESSMENT AND PLAN:  Discussed the following assessment and plan:  Lip symptom  Cheilitis ? Topical emollient  Can add  Miconazole clotrimazole  No other chemicals   Avoid irritants  -Patient advised to return or notify health care team  if symptoms worsen ,persist or new concerns arise. blistgex has spf in it  Patient Instructions  We may never know what started this irritationon the lips area  But  At this time  moisturization  Of the area is indeed the key to this.   NO medication   In the  Lip balms     Lip Vaseline .    No  Scent or menthol .,  Then can use an anti fungal  such as monistat  clotrimazole or miconazole at   At the corner es of the mouth     To treat  What looks like a  Cheilitis   Minimize any chemical exposures until heals    No evidence of deep infections   .  May take 1-2 weeks to totally heal    Burna Mortimer K. Panosh M.D.

## 2017-01-28 ENCOUNTER — Telehealth: Payer: Self-pay | Admitting: Family Medicine

## 2017-01-28 NOTE — Telephone Encounter (Signed)
Copied from CRM [phone]. Topic: General - Other >> Jan 28, 2017  3:49 PM Cheryl Petersen, Selina wrote: Reason for CRM: Patient is requesting a call back from Dr. Fabian SharpPanosh assistant @ [phone]. Patient stated that she was in the office two weeks ago 01/14/17 to see Dr. Fabian SharpPanosh about her lip been swollen, the symptoms has not improved..Marland Kitchen

## 2017-01-30 NOTE — Telephone Encounter (Signed)
Called patient and left message to return call. Dr. Fabian SharpPanosh is out of the office through next week.  If patient is following instructions from previous visit and still having no improvement in symptoms, would recommend follow-up visit with another provider in the office.

## 2017-02-04 NOTE — Telephone Encounter (Signed)
LM for patient again checking on her to see if symptoms are still present. Pt advised to contact our office to let us know how she is doing and if she feels she needs to be seen - can be worked in on Dr Enterprise ProductsPanosh's schedule next week.

## 2017-06-23 LAB — HM COLONOSCOPY

## 2017-07-02 ENCOUNTER — Encounter: Payer: Self-pay | Admitting: Internal Medicine

## 2018-01-11 ENCOUNTER — Encounter: Payer: Managed Care, Other (non HMO) | Admitting: Internal Medicine

## 2018-01-12 NOTE — Progress Notes (Signed)
Chief Complaint  Patient presents with  . Annual Exam    Pt is healthy and well today     HPI: Patient  Cheryl Petersen  53 y.o. comes in today for Preventive Health Care visit   From for job   Health Maintenance  Topic Date Due  . HIV Screening  03/12/1982  . MAMMOGRAM  07/07/2019  . PAP SMEAR-Modifier  09/06/2020  . TETANUS/TDAP  01/08/2027  . COLONOSCOPY  06/24/2027  . INFLUENZA VACCINE  Completed   Health Maintenance Review LIFESTYLE:  Exercise:   Joined gym.   Walking   Tobacco/ETS:  n Alcohol:  ocass Sugar beverages:  No  Gingers ale  Sleep: 7  Drug use: no HH of   4  No pets  In between .  Work:  Printmaker    Not in summer  Periods  Endometrial ablation  ocass hot fluhses   ROS:  GEN/ HEENT: No fever, significant weight changes sweats headaches vision problems hearing changes, CV/ PULM; No chest pain shortness of breath cough, syncope,edema  change in exercise tolerance. GI /GU: No adominal pain, vomiting, change in bowel habits. No blood in the stool. No significant GU symptoms. SKIN/HEME: ,no acute skin rashes suspicious lesions or bleeding. No lymphadenopathy, nodules, masses.  NEURO/ PSYCH:  No neurologic signs such as weakness numbness. No depression anxiety. IMM/ Allergy: No unusual infections.  Allergy .   REST of 12 system review negative except as per HPI   Past Medical History:  Diagnosis Date  . Allergic rhinitis   . Multinodular goiter (nontoxic)    benign bx in 2013  last [place] 2015 stable  . 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 CESAREAN SECTION    . ENDOMETRIAL ABLATION     2011  . NO PAST SURGERIES      Family History  Problem Relation Age of Onset  . Healthy Mother   . Hypertension Father   . Hyperlipidemia Father   . Cancer Maternal Grandmother        skin  . Cancer Maternal Grandfather        skin     Social History   Socioeconomic History  . Marital status: Married    Spouse name: Not on file  . Number of children: Not on file  . Years of education: Not on file  . Highest education level: Not on file  Occupational History  . Not on file  Social Needs  . Financial resource strain: Not on file  . Food insecurity:    Worry: Not on file    Inability: Not on file  . Transportation needs:    Medical: Not on file    Non-medical: Not on file  Tobacco Use  . Smoking status: Never Smoker  . Smokeless tobacco: Never Used  Substance and Sexual Activity  . Alcohol use: Yes    Comment: OCC  . Drug use: No  . Sexual activity: Yes  Lifestyle  . Physical activity:    Days per week: Not on file    Minutes per session: Not on file  . Stress: Not on file  Relationships  . Social connections:    Talks on phone: Not on file    Gets together: Not on file    Attends religious service: Not on file  Active member of club or organization: Not on file    Attends meetings of clubs or organizations: Not on file    Relationship status: Not on file  Other Topics Concern  . 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 fluticasone (FLONASE) 50 MCG/ACT nasal spray Place 2 sprays into both nostrils daily. 16 g 11   No facility-administered medications prior to visit.      EXAM:  BP 122/84 (BP Location: Right Arm, Patient Position: Sitting, Cuff Size: Normal)   Pulse 70   Temp 97.9 F (36.6 C) (Oral)   Ht 5' 5.75" (1.67 m)   Wt 148 lb 3.2 oz (67.2 kg)   BMI 24.10 kg/m   Body mass index is 24.1 kg/m. Wt Readings from Last 3 Encounters:  01/13/18 148 lb 3.2 oz (67.2 kg)  01/14/17 146 lb (66.2 kg)  01/07/17 146 lb 3.2 oz (66.3 kg)    Physical Exam: Vital signs  reviewed PXT:GGYI is a well-developed well-nourished alert cooperative    who appearsr stated age in no acute distress.  HEENT: normocephalic atraumatic , Eyes: PERRL EOM's full, conjunctiva clear, Nares: paten,t no deformity discharge or tenderness., Ears: no deformity EAC's clear TMs with normal landmarks. Mouth: clear OP, no lesions, edema.  Moist mucous membranes. Dentition in adequate repair. NECK: supple without masses, thyromegaly or bruits. CHEST/PULM:  Clear to auscultation and percussion breath sounds equal no wheeze , rales or rhonchi. No chest wall deformities or tenderness. Breast: normal by inspection . No dimpling, discharge, masses, tenderness or discharge . CV: PMI is nondisplaced, S1 S2 no gallops, murmurs, rubs. Peripheral pulses are full without delay.No JVD .  ABDOMEN: Bowel sounds normal nontender  No guard or rebound, no hepato splenomegal no CVA tenderness.  No hernia. Extremtities:  No clubbing cyanosis or edema, no acute joint swelling or redness no focal atrophy NEURO:  Oriented x3, cranial nerves 3-12 appear to be intact, no obvious focal weakness,gait within normal limits no abnormal reflexes or asymmetrical SKIN: No acute rashes normal turgor, color, no bruising or petechiae. PSYCH: Oriented, good eye contact, no obvious depression anxiety, cognition and judgment appear normal. LN: no cervical axillary inguinal adenopathy  Lab Results  Component Value Date   WBC 4.9 01/07/2017   HGB 14.4 01/07/2017   HCT 43.6 01/07/2017   PLT 294.0 01/07/2017   GLUCOSE 89 01/07/2017   CHOL 192 01/07/2017   TRIG 102.0 01/07/2017   HDL 59.50 01/07/2017   LDLCALC 112 (H) 01/07/2017   ALT 15 01/07/2017   AST 16 01/07/2017   NA 141 01/07/2017   K 4.4 01/07/2017   CL 103 01/07/2017   CREATININE 0.85 01/07/2017   BUN 16 01/07/2017   CO2 31 01/07/2017   TSH 1.15 01/07/2017    BP Readings from Last 3 Encounters:  01/13/18 122/84  01/14/17 96/70  01/07/17 122/70       ASSESSMENT AND PLAN:  Discussed the following assessment and plan:  Visit for preventive health examination No lab indicated today   Get screen ing   lab next year   Lipids   tsh hiv today .  Patient Care Team: Beatrice Sehgal, Standley Brooking, MD as PCP - General (Internal Medicine) Danella Sensing, MD (Dermatology)  gyne Terrance Mass, MD (Inactive) as Consulting Physician (Gynecology) Patient Instructions  Glad you are doing well   Can do lab worlk cholesterol etc next year .   Preventive Care 40-64 Years, Female Preventive care refers to lifestyle choices and visits with your health care provider that can promote health and wellness. What does preventive care include?   A yearly physical exam. This is also called an annual well check.  Dental exams once or twice a year.  Routine eye exams. Ask your health care provider how often you should have your eyes checked.  Personal lifestyle choices, including: ? Daily care of your teeth and gums. ? Regular physical activity. ? Eating a healthy diet. ? Avoiding tobacco and drug use. ? Limiting alcohol use. ? Practicing safe sex. ? Taking low-dose aspirin daily starting at age 54. ? Taking vitamin and mineral supplements as recommended by your health care provider. What happens during an annual well check? The services and screenings done by your health care provider during your annual well check will depend on your age, overall health, lifestyle risk factors, and family history of disease. Counseling Your health care provider may ask you questions about your:  Alcohol use.  Tobacco use.  Drug use.  Emotional well-being.  Home and relationship well-being.  Sexual activity.  Eating habits.  Work and work Statistician.  Method of birth control.  Menstrual cycle.  Pregnancy history. Screening You may have the following tests or measurements:  Height, weight, and BMI.  Blood pressure.  Lipid and cholesterol  levels. These may be checked every 5 years, or more frequently if you are over 87 years old.  Skin check.  Lung cancer screening. You may have this screening every year starting at age 29 if you have a 30-pack-year history of smoking and currently smoke or have quit within the past 15 years.  Colorectal cancer screening. All adults should have this screening starting at age 7 and continuing until age 58. Your health care provider may recommend screening at age 66. You will have tests every 1-10 years, depending on your results and the type of screening test. People at increased risk should start screening at an earlier age. Screening tests may include: ? Guaiac-based fecal occult blood testing. ? Fecal immunochemical test (FIT). ? Stool DNA test. ? Virtual colonoscopy. ? Sigmoidoscopy. During this test, a flexible tube with a tiny camera (sigmoidoscope) is used to examine your rectum and lower colon. The sigmoidoscope is inserted through your anus into your rectum and lower colon. ? Colonoscopy. During this test, a long, thin, flexible tube with a tiny camera (colonoscope) is used to examine your entire colon and rectum.  Hepatitis C blood test.  Hepatitis B blood test.  Sexually transmitted disease (STD) testing.  Diabetes screening. This is done by checking your blood sugar (glucose) after you have not eaten for a while (fasting). You may have this done every 1-3 years.  Mammogram. This may be done every 1-2 years. Talk to your health care provider about when you should start having regular mammograms. This may depend on whether you have a family history of breast cancer.  BRCA-related cancer screening. This may be done if you have a family history of breast, ovarian, tubal, or peritoneal cancers.  Pelvic exam and Pap test. This may be done every 3 years starting at age 62. Starting at age 37, this may be done every 5 years if you have a Pap test in combination with an HPV  test.  Bone  density scan. This is done to screen for osteoporosis. You may have this scan if you are at high risk for osteoporosis. Discuss your test results, treatment options, and if necessary, the need for more tests with your health care provider. Vaccines Your health care provider may recommend certain vaccines, such as:  Influenza vaccine. This is recommended every year.  Tetanus, diphtheria, and acellular pertussis (Tdap, Td) vaccine. You may need a Td booster every 10 years.  Varicella vaccine. You may need this if you have not been vaccinated.  Zoster vaccine. You may need this after age 61.  Measles, mumps, and rubella (MMR) vaccine. You may need at least one dose of MMR if you were born in 1957 or later. You may also need a second dose.  Pneumococcal 13-valent conjugate (PCV13) vaccine. You may need this if you have certain conditions and were not previously vaccinated.  Pneumococcal polysaccharide (PPSV23) vaccine. You may need one or two doses if you smoke cigarettes or if you have certain conditions.  Meningococcal vaccine. You may need this if you have certain conditions.  Hepatitis A vaccine. You may need this if you have certain conditions or if you travel or work in places where you may be exposed to hepatitis A.  Hepatitis B vaccine. You may need this if you have certain conditions or if you travel or work in places where you may be exposed to hepatitis B.  Haemophilus influenzae type b (Hib) vaccine. You may need this if you have certain conditions. Talk to your health care provider about which screenings and vaccines you need and how often you need them. This information is not intended to replace advice given to you by your health care provider. Make sure you discuss any questions you have with your health care provider. Document Released: 01/19/2015 Document Revised: 02/12/2017 Document Reviewed: 10/24/2014 Elsevier Interactive Patient Education  2019 [place] protect organs, store calcium, anchor muscles, and support the whole body. Keeping your bones strong is important, especially as you get older. You can take actions to help keep your bones strong and healthy. Why is keeping my bones healthy important?  Keeping your bones healthy is important because your body constantly replaces bone cells. Cells get old, and new cells take their place. As we age, we lose bone cells because the body may not be able to make enough new cells to replace the old cells. The amount of bone cells and bone tissue you have is referred to as bone mass. The higher your bone mass, the stronger your bones. The aging process leads to an overall loss of bone mass in the body, which can increase the likelihood of:  Joint pain and stiffness.  Broken bones.  A condition in which the bones become weak and brittle (osteoporosis). A large decline in bone mass occurs in older adults. In women, it occurs about the time of menopause. What actions can I take to keep my bones healthy? Good health habits are important for maintaining healthy bones. This includes eating nutritious foods and exercising regularly. To have healthy bones, you need to get enough of the right minerals and vitamins. Most nutrition experts recommend getting these nutrients from the foods that you eat. In some cases, taking supplements may also be recommended. Doing certain types of exercise is also important for bone health. What are the nutritional recommendations for healthy bones?  Eating a well-balanced diet with plenty of calcium and  vitamin D will help to protect your bones. Nutritional recommendations vary from person to person. Ask your health care provider what is healthy for you. Here are some general guidelines. Get enough calcium Calcium is the most important (essential) mineral for bone health. Most people can get enough calcium from their diet, but supplements may be recommended for people  who are at risk for osteoporosis. Good sources of calcium include:  Dairy products, such as low-fat or nonfat milk, cheese, and yogurt.  Dark green leafy vegetables, such as bok choy and broccoli.  Calcium-fortified foods, such as orange juice, cereal, bread, soy beverages, and tofu products.  Nuts, such as almonds. Follow these recommended amounts for daily calcium intake:  Children, age 51-3: 700 mg.  Children, age 62-8: 1,000 mg.  Children, age 34-13: 1,300 mg.  Teens, age 97-18: 1,300 mg.  Adults, age 35-50: 1,000 mg.  Adults, age 83-70: ? Men: 1,000 mg. ? Women: 1,200 mg.  Adults, age 77 or older: 1,200 mg.  Pregnant and breastfeeding females: ? Teens: 1,300 mg. ? Adults: 1,000 mg. Get enough vitamin D Vitamin D is the most essential vitamin for bone health. It helps the body absorb calcium. Sunlight stimulates the skin to make vitamin D, so be sure to get enough sunlight. If you live in a cold climate or you do not get outside often, your health care provider may recommend that you take vitamin D supplements. Good sources of vitamin D in your diet include:  Egg yolks.  Saltwater fish.  Milk and cereal fortified with vitamin D. Follow these recommended amounts for daily vitamin D intake:  Children and teens, age 51-18: 600 international units.  Adults, age 71 or younger: 400-800 international units.  Adults, age 84 or older: 800-1,000 international units. Get other important nutrients Other nutrients that are important for bone health include:  Phosphorus. This mineral is found in meat, poultry, dairy foods, nuts, and legumes. The recommended daily intake for adult men and adult women is 700 mg.  Magnesium. This mineral is found in seeds, nuts, dark green vegetables, and legumes. The recommended daily intake for adult men is 400-420 mg. For adult women, it is 310-320 mg.  Vitamin K. This vitamin is found in green leafy vegetables. The recommended daily intake is  120 mg for adult men and 90 mg for adult women. What type of physical activity is best for building and maintaining healthy bones? Weight-bearing and strength-building activities are important for building and maintaining healthy bones. Weight-bearing activities cause muscles and bones to work against gravity. Strength-building activities increase the strength of the muscles that support bones. Weight-bearing and muscle-building activities include:  Walking and hiking.  Jogging and running.  Dancing.  Gym exercises.  Lifting weights.  Tennis and racquetball.  Climbing stairs.  Aerobics. Adults should get at least 30 minutes of moderate physical activity on most days. Children should get at least 60 minutes of moderate physical activity on most days. Ask your health care provider what type of exercise is best for you. How can I find out if my bone mass is low? Bone mass can be measured with an X-ray test called a bone mineral density (BMD) test. This test is recommended for all women who are age 36 or older. It may also be recommended for:  Men who are age 628 or older.  People who are at risk for osteoporosis because of: ? Having bones that break easily. ? Having a long-term disease that weakens bones, such as  kidney disease or rheumatoid arthritis. ? Having menopause earlier than normal. ? Taking medicine that weakens bones, such as steroids, thyroid hormones, or hormone treatment for breast cancer or prostate cancer. ? Smoking. ? Drinking three or more alcoholic drinks a day. If you find that you have a low bone mass, you may be able to prevent osteoporosis or further bone loss by changing your diet and lifestyle. Where can I find more information? For more information, check out the following websites:  [place]: AviationTales.fr  [employer] of Health: www.bones.SouthExposed.es  International Osteoporosis Foundation:  Administrator.iofbonehealth.org Summary  The aging process leads to an overall loss of bone mass in the body, which can increase the likelihood of broken bones and osteoporosis.  Eating a well-balanced diet with plenty of calcium and vitamin D will help to protect your bones.  Weight-bearing and strength-building activities are also important for building and maintaining strong bones.  Bone mass can be measured with an X-ray test called a bone mineral density (BMD) test. This information is not intended to replace advice given to you by your health care provider. Make sure you discuss any questions you have with your health care provider. Document Released: 03/15/2003 Document Revised: 01/19/2017 Document Reviewed: 01/19/2017 Elsevier Interactive Patient Education  2019 [place] K. Nashayla Telleria M.D.

## 2018-01-13 ENCOUNTER — Ambulatory Visit (INDEPENDENT_AMBULATORY_CARE_PROVIDER_SITE_OTHER): Payer: Managed Care, Other (non HMO) | Admitting: Internal Medicine

## 2018-01-13 ENCOUNTER — Encounter: Payer: Self-pay | Admitting: Internal Medicine

## 2018-01-13 VITALS — BP 122/84 | HR 70 | Temp 97.9°F | Ht 65.75 in | Wt 148.2 lb

## 2018-01-13 DIAGNOSIS — Z0000 Encounter for general adult medical examination without abnormal findings: Secondary | ICD-10-CM | POA: Diagnosis not present

## 2018-01-13 NOTE — Patient Instructions (Signed)
Glad you are doing well   Can do lab worlk cholesterol etc next year .   Preventive Care 40-64 Years, Female Preventive care refers to lifestyle choices and visits with your health care provider that can promote health and wellness. What does preventive care include?   A yearly physical exam. This is also called an annual well check.  Dental exams once or twice a year.  Routine eye exams. Ask your health care provider how often you should have your eyes checked.  Personal lifestyle choices, including: ? Daily care of your teeth and gums. ? Regular physical activity. ? Eating a healthy diet. ? Avoiding tobacco and drug use. ? Limiting alcohol use. ? Practicing safe sex. ? Taking low-dose aspirin daily starting at age 51. ? Taking vitamin and mineral supplements as recommended by your health care provider. What happens during an annual well check? The services and screenings done by your health care provider during your annual well check will depend on your age, overall health, lifestyle risk factors, and family history of disease. Counseling Your health care provider may ask you questions about your:  Alcohol use.  Tobacco use.  Drug use.  Emotional well-being.  Home and relationship well-being.  Sexual activity.  Eating habits.  Work and work Statistician.  Method of birth control.  Menstrual cycle.  Pregnancy history. Screening You may have the following tests or measurements:  Height, weight, and BMI.  Blood pressure.  Lipid and cholesterol levels. These may be checked every 5 years, or more frequently if you are over 9 years old.  Skin check.  Lung cancer screening. You may have this screening every year starting at age 51 if you have a 30-pack-year history of smoking and currently smoke or have quit within the past 15 years.  Colorectal cancer screening. All adults should have this screening starting at age 51 and continuing until age 12. Your  health care provider may recommend screening at age 9. You will have tests every 1-10 years, depending on your results and the type of screening test. People at increased risk should start screening at an earlier age. Screening tests may include: ? Guaiac-based fecal occult blood testing. ? Fecal immunochemical test (FIT). ? Stool DNA test. ? Virtual colonoscopy. ? Sigmoidoscopy. During this test, a flexible tube with a tiny camera (sigmoidoscope) is used to examine your rectum and lower colon. The sigmoidoscope is inserted through your anus into your rectum and lower colon. ? Colonoscopy. During this test, a long, thin, flexible tube with a tiny camera (colonoscope) is used to examine your entire colon and rectum.  Hepatitis C blood test.  Hepatitis B blood test.  Sexually transmitted disease (STD) testing.  Diabetes screening. This is done by checking your blood sugar (glucose) after you have not eaten for a while (fasting). You may have this done every 1-3 years.  Mammogram. This may be done every 1-2 years. Talk to your health care provider about when you should start having regular mammograms. This may depend on whether you have a family history of breast cancer.  BRCA-related cancer screening. This may be done if you have a family history of breast, ovarian, tubal, or peritoneal cancers.  Pelvic exam and Pap test. This may be done every 3 years starting at age 51. Starting at age 51, this may be done every 5 years if you have a Pap test in combination with an HPV test.  Bone density scan. This is done to screen for osteoporosis. You  may have this scan if you are at high risk for osteoporosis. Discuss your test results, treatment options, and if necessary, the need for more tests with your health care provider. Vaccines Your health care provider may recommend certain vaccines, such as:  Influenza vaccine. This is recommended every year.  Tetanus, diphtheria, and acellular pertussis  (Tdap, Td) vaccine. You may need a Td booster every 10 years.  Varicella vaccine. You may need this if you have not been vaccinated.  Zoster vaccine. You may need this after age 51.  Measles, mumps, and rubella (MMR) vaccine. You may need at least one dose of MMR if you were born in 1957 or later. You may also need a second dose.  Pneumococcal 13-valent conjugate (PCV13) vaccine. You may need this if you have certain conditions and were not previously vaccinated.  Pneumococcal polysaccharide (PPSV23) vaccine. You may need one or two doses if you smoke cigarettes or if you have certain conditions.  Meningococcal vaccine. You may need this if you have certain conditions.  Hepatitis A vaccine. You may need this if you have certain conditions or if you travel or work in places where you may be exposed to hepatitis A.  Hepatitis B vaccine. You may need this if you have certain conditions or if you travel or work in places where you may be exposed to hepatitis B.  Haemophilus influenzae type b (Hib) vaccine. You may need this if you have certain conditions. Talk to your health care provider about which screenings and vaccines you need and how often you need them. This information is not intended to replace advice given to you by your health care provider. Make sure you discuss any questions you have with your health care provider. Document Released: 01/19/2015 Document Revised: 02/12/2017 Document Reviewed: 10/24/2014 Elsevier Interactive Patient Education  2019 [place] protect organs, store calcium, anchor muscles, and support the whole body. Keeping your bones strong is important, especially as you get older. You can take actions to help keep your bones strong and healthy. Why is keeping my bones healthy important?  Keeping your bones healthy is important because your body constantly replaces bone cells. Cells get old, and new cells take their place. As we age, we  lose bone cells because the body may not be able to make enough new cells to replace the old cells. The amount of bone cells and bone tissue you have is referred to as bone mass. The higher your bone mass, the stronger your bones. The aging process leads to an overall loss of bone mass in the body, which can increase the likelihood of:  Joint pain and stiffness.  Broken bones.  A condition in which the bones become weak and brittle (osteoporosis). A large decline in bone mass occurs in older adults. In women, it occurs about the time of menopause. What actions can I take to keep my bones healthy? Good health habits are important for maintaining healthy bones. This includes eating nutritious foods and exercising regularly. To have healthy bones, you need to get enough of the right minerals and vitamins. Most nutrition experts recommend getting these nutrients from the foods that you eat. In some cases, taking supplements may also be recommended. Doing certain types of exercise is also important for bone health. What are the nutritional recommendations for healthy bones?  Eating a well-balanced diet with plenty of calcium and vitamin D will help to protect your bones. Nutritional recommendations vary from person  to person. Ask your health care provider what is healthy for you. Here are some general guidelines. Get enough calcium Calcium is the most important (essential) mineral for bone health. Most people can get enough calcium from their diet, but supplements may be recommended for people who are at risk for osteoporosis. Good sources of calcium include:  Dairy products, such as low-fat or nonfat milk, cheese, and yogurt.  Dark green leafy vegetables, such as bok choy and broccoli.  Calcium-fortified foods, such as orange juice, cereal, bread, soy beverages, and tofu products.  Nuts, such as almonds. Follow these recommended amounts for daily calcium intake:  Children, age 48-3: 700  mg.  Children, age 45-8: 1,000 mg.  Children, age 24-13: 1,300 mg.  Teens, age 65-18: 1,300 mg.  Adults, age 48-50: 1,000 mg.  Adults, age 52-70: ? Men: 1,000 mg. ? Women: 1,200 mg.  Adults, age 6 or older: 1,200 mg.  Pregnant and breastfeeding females: ? Teens: 1,300 mg. ? Adults: 1,000 mg. Get enough vitamin D Vitamin D is the most essential vitamin for bone health. It helps the body absorb calcium. Sunlight stimulates the skin to make vitamin D, so be sure to get enough sunlight. If you live in a cold climate or you do not get outside often, your health care provider may recommend that you take vitamin D supplements. Good sources of vitamin D in your diet include:  Egg yolks.  Saltwater fish.  Milk and cereal fortified with vitamin D. Follow these recommended amounts for daily vitamin D intake:  Children and teens, age 48-18: 600 international units.  Adults, age 67 or younger: 400-800 international units.  Adults, age 78 or older: 800-1,000 international units. Get other important nutrients Other nutrients that are important for bone health include:  Phosphorus. This mineral is found in meat, poultry, dairy foods, nuts, and legumes. The recommended daily intake for adult men and adult women is 700 mg.  Magnesium. This mineral is found in seeds, nuts, dark green vegetables, and legumes. The recommended daily intake for adult men is 400-420 mg. For adult women, it is 310-320 mg.  Vitamin K. This vitamin is found in green leafy vegetables. The recommended daily intake is 120 mg for adult men and 90 mg for adult women. What type of physical activity is best for building and maintaining healthy bones? Weight-bearing and strength-building activities are important for building and maintaining healthy bones. Weight-bearing activities cause muscles and bones to work against gravity. Strength-building activities increase the strength of the muscles that support bones.  Weight-bearing and muscle-building activities include:  Walking and hiking.  Jogging and running.  Dancing.  Gym exercises.  Lifting weights.  Tennis and racquetball.  Climbing stairs.  Aerobics. Adults should get at least 30 minutes of moderate physical activity on most days. Children should get at least 60 minutes of moderate physical activity on most days. Ask your health care provider what type of exercise is best for you. How can I find out if my bone mass is low? Bone mass can be measured with an X-ray test called a bone mineral density (BMD) test. This test is recommended for all women who are age 51 or older. It may also be recommended for:  Men who are age 455 or older.  People who are at risk for osteoporosis because of: ? Having bones that break easily. ? Having a long-term disease that weakens bones, such as kidney disease or rheumatoid arthritis. ? Having menopause earlier than normal. ? Taking  medicine that weakens bones, such as steroids, thyroid hormones, or hormone treatment for breast cancer or prostate cancer. ? Smoking. ? Drinking three or more alcoholic drinks a day. If you find that you have a low bone mass, you may be able to prevent osteoporosis or further bone loss by changing your diet and lifestyle. Where can I find more information? For more information, check out the following websites:  [place]: AviationTales.fr  [employer] of Health: www.bones.SouthExposed.es  International Osteoporosis Foundation: Administrator.iofbonehealth.org Summary  The aging process leads to an overall loss of bone mass in the body, which can increase the likelihood of broken bones and osteoporosis.  Eating a well-balanced diet with plenty of calcium and vitamin D will help to protect your bones.  Weight-bearing and strength-building activities are also important for building and maintaining strong bones.  Bone mass can be measured with an X-ray  test called a bone mineral density (BMD) test. This information is not intended to replace advice given to you by your health care provider. Make sure you discuss any questions you have with your health care provider. Document Released: 03/15/2003 Document Revised: 01/19/2017 Document Reviewed: 01/19/2017 Elsevier Interactive Patient Education  2019 [employer].

## 2018-12-15 ENCOUNTER — Other Ambulatory Visit: Payer: Self-pay

## 2018-12-15 DIAGNOSIS — Z20822 Contact with and (suspected) exposure to covid-19: Secondary | ICD-10-CM

## 2018-12-16 LAB — NOVEL CORONAVIRUS, NAA: SARS-CoV-2, NAA: NOT DETECTED

## 2018-12-21 ENCOUNTER — Encounter: Payer: Managed Care, Other (non HMO) | Admitting: Internal Medicine

## 2019-01-17 ENCOUNTER — Other Ambulatory Visit: Payer: Self-pay

## 2019-01-17 ENCOUNTER — Telehealth: Payer: Self-pay | Admitting: *Deleted

## 2019-01-17 NOTE — Progress Notes (Signed)
Chief Complaint  Patient presents with  . Annual Exam    pt has no concerns today     HPI: Patient  Cheryl Petersen  54 y.o. comes in today for Preventive Health Care visit  Needs form for work signed  Doing well teaching in person  GDS  Sees gyne due soon utd   Health Maintenance  Topic Date Due  . HIV Screening  03/12/1982  . MAMMOGRAM  07/07/2019  . PAP SMEAR-Modifier  09/06/2020  . TETANUS/TDAP  01/08/2027  . COLONOSCOPY  06/24/2027  . INFLUENZA VACCINE  Completed   Health Maintenance Review LIFESTYLE:  Exercise:   Walk   Work .  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 GI /GU: No adominal pain, vomiting, change in bowel habits. No blood in the stool. No significant GU symptoms. SKIN/HEME: ,no acute skin rashes suspicious lesions or bleeding. No lymphadenopathy, nodules, masses.  Uses cream on right shin per derm not resolving off and on for years  itches NEURO/ PSYCH:  No neurologic signs such as weakness numbness. No depression anxiety. IMM/ Allergy: No unusual infections.  Allergy .   REST of 12 system review negative except as per HPI   Past Medical History:  Diagnosis Date  . Allergic rhinitis   . Multinodular goiter (nontoxic)    benign bx in 2013   [place] 2015 and 2017  stable  . 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 CESAREAN SECTION    . ENDOMETRIAL ABLATION     2011  . NO PAST SURGERIES      Family History  Problem Relation Age of  Onset  . Healthy Mother   . Hypertension Father   . Hyperlipidemia Father   . Cancer Maternal Grandmother        skin  . Cancer Maternal Grandfather        skin      Outpatient Medications Prior to Visit  Medication Sig Dispense Refill  . fluticasone (FLONASE) 50 MCG/ACT nasal spray Place 2 sprays into both nostrils daily. 16 g 11   No facility-administered medications prior to visit.     EXAM:  BP 120/62 (BP Location: Right Arm, Patient Position: Sitting, Cuff Size: Normal)   Pulse 67   Temp 98.4 F (36.9 C) (Temporal)   Ht _0  (1.676 m)   Wt 154 lb 12.8 oz (70.2 kg)   SpO2 97%   BMI 24.99 kg/m   Body mass index is 24.99 kg/m. Wt Readings from Last 3 Encounters:  01/18/19 154 lb 12.8 oz (70.2 kg)  01/13/18 148 lb 3.2 oz (67.2 kg)  01/14/17 146 lb (66.2 kg)    Physical Exam: Vital signs reviewed UDJ:SHFW is a well-developed well-nourished alert cooperative    who appearsr stated age in no acute distress.  HEENT: normocephalic atraumatic , Eyes: PERRL EOM's full, conjunctiva clear, ,  Ears: no deformity EAC's clear TMs with normal landmarks. Mouth: clear OP masked NECK: supple without masses, tI dont hear a bruit today  Thyroid pal no obv nodules  CHEST/PULM:  Clear to auscultation and percussion breath sounds equal no wheeze , rales or rhonchi.  Breast: normal by inspection . No dimpling, discharge, masses, tenderness or discharge . CV: PMI is nondisplaced, S1 S2 no gallops, murmurs, rubs. Peripheral pulses are full without delay.No JVD .  ABDOMEN: Bowel sounds normal nontender  No guard or rebound, no hepato splenomegal no CVA tenderness.  No hernia. Extremtities:  No clubbing cyanosis or edema, no acute joint swelling or redness no focal atrophy NEURO:  Oriented x3, cranial nerves 3-12 appear to be intact, no obvious focal weakness,gait within normal limits no abnormal reflexes or asymmetrical SKIN: No acute rashes normal turgor, color, no bruising or petechiae.  righ shin are large faint pink patch round indistinct borders  PSYCH: Oriented, good eye contact, no obvious depression anxiety, cognition and judgment appear normal. LN: no cervical axillary inguinal adenopathy  Lab Results  Component Value Date   WBC 4.9 01/07/2017   HGB 14.4 01/07/2017   HCT 43.6 01/07/2017   PLT 294.0 01/07/2017   GLUCOSE 89 01/07/2017   CHOL 192 01/07/2017   TRIG 102.0 01/07/2017   HDL 59.50 01/07/2017   LDLCALC 112 (H) 01/07/2017   ALT 15 01/07/2017   AST 16 01/07/2017   NA 141 01/07/2017   K 4.4 01/07/2017   CL 103 01/07/2017   CREATININE 0.85 01/07/2017   BUN 16 01/07/2017   CO2 31 01/07/2017   TSH 1.15 01/07/2017    BP Readings from Last 3 Encounters:  01/18/19 120/62  01/13/18 122/84  01/14/17 96/70    Lab plan reviewed with patient   ASSESSMENT AND PLAN:  Discussed the following assessment and plan:    ICD-10-CM   1. Visit for preventive health examination  Z00.00 CBC with Differential    Basic metabolic panel    Hepatic function panel    Lipid panel    TSH    T4, Free (Thyrox)  2. Multinodular goiter (nontoxic)  E04.2 TSH    T4, Free (Thyrox)   Lab today  Last [place] benign  Get yearly  neck  examconsider fu [place]  If needed Last 2017 Ask dern about  meds  Or let us know what trying for on goin skin dermatitis  Form signed  Patient Care Team: Marvion Bastidas, Standley Brooking, MD as PCP - General (Internal Medicine) Danella Sensing, MD (Dermatology) gyne Patient Instructions  Will notify you  of labs when available. Glad you are doing well.   Continue lifestyle intervention healthy eating and exercise .     Preventive Care 38-51 Years Old, Female Preventive care refers to visits with your health care provider and lifestyle choices that can promote health and wellness. This includes:  A yearly physical exam. This may also be called an annual well check.  Regular dental visits and eye exams.  Immunizations.  Screening for certain conditions.   Healthy lifestyle choices, such as eating a healthy diet, getting regular exercise, not using drugs or products that contain nicotine and tobacco, and limiting alcohol use. What can I expect for my preventive care visit? Physical exam Your health care provider will check your:  Height and weight. This may be used to calculate body mass index (BMI), which tells if you are at a healthy weight.  Heart rate and blood pressure.  Skin for abnormal spots.  Counseling Your health care provider may ask you questions about your:  Alcohol, tobacco, and drug use.  Emotional well-being.  Home and relationship well-being.  Sexual activity.  Eating habits.  Work and work Statistician.  Method of birth control.  Menstrual cycle.  Pregnancy history. What immunizations do I need?  Influenza (flu) vaccine  This is recommended every year. Tetanus, diphtheria, and pertussis (Tdap) vaccine  You may need a Td booster every 10 years. Varicella (chickenpox) vaccine  You may need this if you have not been vaccinated. Zoster (shingles) vaccine  You may need this after age 27. Measles, mumps, and rubella (MMR) vaccine  You may need at least one dose of MMR if you were born in 1957 or later. You may also need a second dose. Pneumococcal conjugate (PCV13) vaccine  You may need this if you have certain conditions and were not previously vaccinated. Pneumococcal polysaccharide (PPSV23) vaccine  You may need one or two doses if you smoke cigarettes or if you have certain conditions. Meningococcal conjugate (MenACWY) vaccine  You may need this if you have certain conditions. Hepatitis A vaccine  You may need this if you have certain conditions or if you travel or work in places where you may be exposed to hepatitis A. Hepatitis B vaccine  You may need this if you have certain conditions or if you travel or work in places where you may be exposed to hepatitis B. Haemophilus influenzae type b  (Hib) vaccine  You may need this if you have certain conditions. Human papillomavirus (HPV) vaccine  If recommended by your health care provider, you may need three doses over 6 months. You may receive vaccines as individual doses or as more than one vaccine together in one shot (combination vaccines). Talk with your health care provider about the risks and benefits of combination vaccines. What tests do I need? Blood tests  Lipid and cholesterol levels. These may be checked every 5 years, or more frequently if you are over 27 years old.  Hepatitis C test.  Hepatitis B test. Screening  Lung cancer screening. You may have this screening every year starting at age 13 if you have a 30-pack-year history of smoking and currently smoke or have quit within the past 15 years.  Colorectal cancer screening. All adults should have this screening starting at age 72 and continuing until age 44. Your health care provider may recommend screening at age 80 if you are at increased risk. You will have tests every 1-10 years, depending on your results and the type of screening test.  Diabetes screening. This is done by checking your blood sugar (glucose) after you have not eaten for a while (fasting). You may have this done every 1-3 years.  Mammogram. This may be done every 1-2 years. Talk with your health care provider about when you should start having regular mammograms. This may depend on whether you have a family history of breast cancer.  BRCA-related cancer screening. This may be done if you have a family history of breast, ovarian, tubal, or peritoneal cancers.  Pelvic exam and Pap test. This may be done every 3 years starting at age 57. Starting at age 51, this may be done every 5 years if you have a Pap test in combination with an HPV test. Other tests  Sexually transmitted disease (STD) testing.  Bone density scan. This is done to screen for osteoporosis. You may have this scan if you are at  high risk for osteoporosis.  Follow these instructions at home: Eating and drinking  Eat a diet that includes fresh fruits and vegetables, whole grains, lean protein, and low-fat dairy.  Take vitamin and mineral supplements as recommended by your health care provider.  Do not drink alcohol if: ? Your health care provider tells you not to drink. ? You are pregnant, may be pregnant, or are planning to become pregnant.  If you drink alcohol: ? Limit how much you have to 0-1 drink a day. ? Be aware of how much alcohol is in your drink. In the [place] one drink equals one 12 oz bottle of beer (355 mL), one 5 oz glass of wine (148 mL), or one 1 oz glass of hard liquor (44 mL). Lifestyle  Take daily care of your teeth and gums.  Stay active. Exercise for at least 30 minutes on 5 or more days each week.  Do not use any products that contain nicotine or tobacco, such as cigarettes, e-cigarettes, and chewing tobacco. If you need help quitting, ask your health care provider.  If you are sexually active, practice safe sex. Use a condom or other form of birth control (contraception) in order to prevent pregnancy and STIs (sexually transmitted infections).  If told by your health care provider, take low-dose aspirin daily starting at age 70. What's next?  Visit your health care provider once a year for a well check visit.  Ask your health care provider how often you should have your eyes and teeth checked.  Stay up to date on all vaccines. This information is not intended to replace advice given to you by your health care provider. Make sure you discuss any questions you have with your health care provider. Document Revised: 09/03/2017 Document Reviewed: 09/03/2017 Elsevier Patient Education  2020 [place] Quenesha Douglass M.D.

## 2019-01-17 NOTE — Telephone Encounter (Signed)
Returned call to patient to complete screening.   Copied from CRM [id number]. Topic: General - Inquiry >> Jan 17, 2019  4:42 PM Leary Roca wrote: Reason for CRM: patient returning call for covid screening

## 2019-01-18 ENCOUNTER — Ambulatory Visit (INDEPENDENT_AMBULATORY_CARE_PROVIDER_SITE_OTHER): Payer: Managed Care, Other (non HMO) | Admitting: Internal Medicine

## 2019-01-18 ENCOUNTER — Encounter: Payer: Self-pay | Admitting: Internal Medicine

## 2019-01-18 VITALS — BP 120/62 | HR 67 | Temp 98.4°F | Ht 66.0 in | Wt 154.8 lb

## 2019-01-18 DIAGNOSIS — E042 Nontoxic multinodular goiter: Secondary | ICD-10-CM | POA: Diagnosis not present

## 2019-01-18 DIAGNOSIS — Z0000 Encounter for general adult medical examination without abnormal findings: Secondary | ICD-10-CM

## 2019-01-18 LAB — CBC WITH DIFFERENTIAL/PLATELET
Basophils Absolute: 0 10*3/uL (ref 0.0–0.1)
Basophils Relative: 0.8 % (ref 0.0–3.0)
Eosinophils Absolute: 0 10*3/uL (ref 0.0–0.7)
Eosinophils Relative: 1.1 % (ref 0.0–5.0)
HCT: 42.3 % (ref 36.0–46.0)
Hemoglobin: 14.1 g/dL (ref 12.0–15.0)
Lymphocytes Relative: 23.5 % (ref 12.0–46.0)
Lymphs Abs: 1 10*3/uL (ref 0.7–4.0)
MCHC: 33.4 g/dL (ref 30.0–36.0)
MCV: 90.4 fl (ref 78.0–100.0)
Monocytes Absolute: 0.4 10*3/uL (ref 0.1–1.0)
Monocytes Relative: 8.6 % (ref 3.0–12.0)
Neutro Abs: 2.8 10*3/uL (ref 1.4–7.7)
Neutrophils Relative %: 66 % (ref 43.0–77.0)
Platelets: 306 10*3/uL (ref 150.0–400.0)
RBC: 4.68 Mil/uL (ref 3.87–5.11)
RDW: 12 % (ref 11.5–15.5)
WBC: 4.2 10*3/uL (ref 4.0–10.5)

## 2019-01-18 LAB — BASIC METABOLIC PANEL
BUN: 15 mg/dL (ref 6–23)
CO2: 29 mEq/L (ref 19–32)
Calcium: 9.3 mg/dL (ref 8.4–10.5)
Chloride: 104 mEq/L (ref 96–112)
Creatinine, Ser: 0.82 mg/dL (ref 0.40–1.20)
GFR: 73.25 mL/min (ref >60.00)
Glucose, Bld: 100 mg/dL — ABNORMAL HIGH (ref 70–99)
Potassium: 4.3 mEq/L (ref 3.5–5.1)
Sodium: 140 mEq/L (ref 135–145)

## 2019-01-18 LAB — LIPID PANEL
Cholesterol: 218 mg/dL — ABNORMAL HIGH (ref 0–200)
HDL: 79.5 mg/dL (ref >39.00)
LDL Cholesterol: 128 mg/dL — ABNORMAL HIGH (ref 0–99)
NonHDL: 138.46
Total CHOL/HDL Ratio: 3
Triglycerides: 50 mg/dL (ref 0.0–149.0)
VLDL: 10 mg/dL (ref 0.0–40.0)

## 2019-01-18 LAB — HEPATIC FUNCTION PANEL
ALT: 16 U/L (ref 0–35)
AST: 18 U/L (ref 0–37)
Albumin: 4.4 g/dL (ref 3.5–5.2)
Alkaline Phosphatase: 75 U/L (ref 39–117)
Bilirubin, Direct: 0.1 mg/dL (ref 0.0–0.3)
Total Bilirubin: 0.5 mg/dL (ref 0.2–1.2)
Total Protein: 6.9 g/dL (ref 6.0–8.3)

## 2019-01-18 LAB — TSH: TSH: 1.11 u[IU]/mL (ref 0.35–4.50)

## 2019-01-18 LAB — T4, FREE: Free T4: 0.93 ng/dL (ref 0.60–1.60)

## 2019-01-18 NOTE — Patient Instructions (Signed)
Will notify you  of labs when available. Glad you are doing well.   Continue lifestyle intervention healthy eating and exercise .     Preventive Care 10-52 Years Old, Female Preventive care refers to visits with your health care provider and lifestyle choices that can promote health and wellness. This includes:  A yearly physical exam. This may also be called an annual well check.  Regular dental visits and eye exams.  Immunizations.  Screening for certain conditions.  Healthy lifestyle choices, such as eating a healthy diet, getting regular exercise, not using drugs or products that contain nicotine and tobacco, and limiting alcohol use. What can I expect for my preventive care visit? Physical exam Your health care provider will check your:  Height and weight. This may be used to calculate body mass index (BMI), which tells if you are at a healthy weight.  Heart rate and blood pressure.  Skin for abnormal spots. Counseling Your health care provider may ask you questions about your:  Alcohol, tobacco, and drug use.  Emotional well-being.  Home and relationship well-being.  Sexual activity.  Eating habits.  Work and work Statistician.  Method of birth control.  Menstrual cycle.  Pregnancy history. What immunizations do I need?  Influenza (flu) vaccine  This is recommended every year. Tetanus, diphtheria, and pertussis (Tdap) vaccine  You may need a Td booster every 10 years. Varicella (chickenpox) vaccine  You may need this if you have not been vaccinated. Zoster (shingles) vaccine  You may need this after age 68. Measles, mumps, and rubella (MMR) vaccine  You may need at least one dose of MMR if you were born in 1957 or later. You may also need a second dose. Pneumococcal conjugate (PCV13) vaccine  You may need this if you have certain conditions and were not previously vaccinated. Pneumococcal polysaccharide (PPSV23) vaccine  You may need one or  two doses if you smoke cigarettes or if you have certain conditions. Meningococcal conjugate (MenACWY) vaccine  You may need this if you have certain conditions. Hepatitis A vaccine  You may need this if you have certain conditions or if you travel or work in places where you may be exposed to hepatitis A. Hepatitis B vaccine  You may need this if you have certain conditions or if you travel or work in places where you may be exposed to hepatitis B. Haemophilus influenzae type b (Hib) vaccine  You may need this if you have certain conditions. Human papillomavirus (HPV) vaccine  If recommended by your health care provider, you may need three doses over 6 months. You may receive vaccines as individual doses or as more than one vaccine together in one shot (combination vaccines). Talk with your health care provider about the risks and benefits of combination vaccines. What tests do I need? Blood tests  Lipid and cholesterol levels. These may be checked every 5 years, or more frequently if you are over 57 years old.  Hepatitis C test.  Hepatitis B test. Screening  Lung cancer screening. You may have this screening every year starting at age 59 if you have a 30-pack-year history of smoking and currently smoke or have quit within the past 15 years.  Colorectal cancer screening. All adults should have this screening starting at age 88 and continuing until age 59. Your health care provider may recommend screening at age 41 if you are at increased risk. You will have tests every 1-10 years, depending on your results and the type of  screening test.  Diabetes screening. This is done by checking your blood sugar (glucose) after you have not eaten for a while (fasting). You may have this done every 1-3 years.  Mammogram. This may be done every 1-2 years. Talk with your health care provider about when you should start having regular mammograms. This may depend on whether you have a family history  of breast cancer.  BRCA-related cancer screening. This may be done if you have a family history of breast, ovarian, tubal, or peritoneal cancers.  Pelvic exam and Pap test. This may be done every 3 years starting at age 51. Starting at age 60, this may be done every 5 years if you have a Pap test in combination with an HPV test. Other tests  Sexually transmitted disease (STD) testing.  Bone density scan. This is done to screen for osteoporosis. You may have this scan if you are at high risk for osteoporosis. Follow these instructions at home: Eating and drinking  Eat a diet that includes fresh fruits and vegetables, whole grains, lean protein, and low-fat dairy.  Take vitamin and mineral supplements as recommended by your health care provider.  Do not drink alcohol if: ? Your health care provider tells you not to drink. ? You are pregnant, may be pregnant, or are planning to become pregnant.  If you drink alcohol: ? Limit how much you have to 0-1 drink a day. ? Be aware of how much alcohol is in your drink. In the [place] one drink equals one 12 oz bottle of beer (355 mL), one 5 oz glass of wine (148 mL), or one 1 oz glass of hard liquor (44 mL). Lifestyle  Take daily care of your teeth and gums.  Stay active. Exercise for at least 30 minutes on 5 or more days each week.  Do not use any products that contain nicotine or tobacco, such as cigarettes, e-cigarettes, and chewing tobacco. If you need help quitting, ask your health care provider.  If you are sexually active, practice safe sex. Use a condom or other form of birth control (contraception) in order to prevent pregnancy and STIs (sexually transmitted infections).  If told by your health care provider, take low-dose aspirin daily starting at age 65. What's next?  Visit your health care provider once a year for a well check visit.  Ask your health care provider how often you should have your eyes and teeth checked.  Stay up  to date on all vaccines. This information is not intended to replace advice given to you by your health care provider. Make sure you discuss any questions you have with your health care provider. Document Revised: 09/03/2017 Document Reviewed: 09/03/2017 Elsevier Patient Education  2020 [employer].

## 2019-03-05 ENCOUNTER — Ambulatory Visit: Payer: Managed Care, Other (non HMO) | Attending: Internal Medicine

## 2019-03-05 DIAGNOSIS — Z23 Encounter for immunization: Secondary | ICD-10-CM | POA: Insufficient documentation

## 2019-03-05 NOTE — Progress Notes (Signed)
   Covid-19 Vaccination Clinic  Name:  Cheryl Petersen    MRN: [id number] DOB: [birth year]  03/05/2019  Cheryl Petersen was observed post Covid-19 immunization for 15 minutes without incidence. She was provided with Vaccine Information Sheet and instruction to access the V-Safe system.   Cheryl Petersen was instructed to call 911 with any severe reactions post vaccine: Marland Kitchen Difficulty breathing  . Swelling of your face and throat  . A fast heartbeat  . A bad rash all over your body  . Dizziness and weakness    Immunizations Administered    Name Date Dose VIS Date Route   Pfizer COVID-19 Vaccine 03/05/2019 11:05 AM 0.3 mL 12/17/2018 Intramuscular   Manufacturer: [employer], [employer]   Lot: [id number]   NDC: [address]-6

## 2019-03-30 ENCOUNTER — Ambulatory Visit: Payer: Managed Care, Other (non HMO) | Attending: Internal Medicine

## 2019-03-30 DIAGNOSIS — Z23 Encounter for immunization: Secondary | ICD-10-CM

## 2019-03-30 NOTE — Progress Notes (Signed)
   Covid-19 Vaccination Clinic  Name:  Cheryl Petersen    MRN: [id number] DOB: April 28, 1965  03/30/2019  [name] was observed post Covid-19 immunization for 15 minutes without incident. She was provided with Vaccine Information Sheet and instruction to access the V-Safe system.   [name] was instructed to call 911 with any severe reactions post vaccine: Marland Kitchen Difficulty breathing  . Swelling of face and throat  . A fast heartbeat  . A bad rash all over body  . Dizziness and weakness   Immunizations Administered    Name Date Dose VIS Date Route   Pfizer COVID-19 Vaccine 03/30/2019 11:45 AM 0.3 mL 12/17/2018 Intramuscular   Manufacturer: [employer], [employer]   Lot: [id number]   NDC: [address]-8

## 2019-12-23 ENCOUNTER — Encounter: Payer: Self-pay | Admitting: Internal Medicine

## 2020-01-20 ENCOUNTER — Encounter: Payer: Managed Care, Other (non HMO) | Admitting: Internal Medicine

## 2020-01-30 ENCOUNTER — Encounter: Payer: Self-pay | Admitting: Internal Medicine

## 2020-01-30 ENCOUNTER — Ambulatory Visit (INDEPENDENT_AMBULATORY_CARE_PROVIDER_SITE_OTHER): Payer: Managed Care, Other (non HMO) | Admitting: Internal Medicine

## 2020-01-30 ENCOUNTER — Other Ambulatory Visit: Payer: Self-pay

## 2020-01-30 VITALS — BP 125/80 | HR 72 | Temp 97.7°F | Ht 66.0 in | Wt 149.0 lb

## 2020-01-30 DIAGNOSIS — E042 Nontoxic multinodular goiter: Secondary | ICD-10-CM | POA: Diagnosis not present

## 2020-01-30 DIAGNOSIS — Z0000 Encounter for general adult medical examination without abnormal findings: Secondary | ICD-10-CM

## 2020-01-30 NOTE — Progress Notes (Signed)
Chief Complaint  Patient presents with  . Annual Exam    HPI: Patient  Cheryl Petersen  55 y.o. comes in today for Preventive Health Care visit  Had gyne check Dr Rana Snare in the fall  Labs done  utd   Has form to sign  Is menopausal   Health Maintenance  Topic Date Due  . Hepatitis C Screening  Never done  . HIV Screening  Never done  . MAMMOGRAM  07/07/2019  . COVID-19 Vaccine (3 - Booster for Pfizer series) 09/30/2019  . PAP SMEAR-Modifier  09/06/2020  . TETANUS/TDAP  01/08/2027  . COLONOSCOPY (Pts 45-75yrs Insurance coverage will need to be confirmed)  06/24/2027  . INFLUENZA VACCINE  Completed   Health Maintenance Review LIFESTYLE:  Exercise:   Walk   Tobacco/ETS: Alcohol:  moderation Sugar beverages: staying away  Sleep: 7 hours  Drug use: no suppl  Calcium  HH of 3  One dog  Work: ft in person .   School.     ROS:  GEN/ HEENT: No fever, significant weight changes sweats headaches vision problems hearing changes, CV/ PULM; No chest pain shortness of breath cough, syncope,edema  change in exercise tolerance. GI /GU: No adominal pain, vomiting, change in bowel habits. No blood in the stool. No significant GU symptoms. SKIN/HEME: ,no acute skin rashes suspicious lesions or bleeding. No lymphadenopathy, nodules, masses.  NEURO/ PSYCH:  No neurologic signs such as weakness numbness. No depression anxiety. IMM/ Allergy: No unusual infections.  Allergy .   REST of 12 system review negative except as per HPI   Past Medical History:  Diagnosis Date  . Allergic rhinitis   . Multinodular goiter (nontoxic)    benign bx in 2013   [place] 2015 and 2017  stable  . 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 CESAREAN SECTION    . ENDOMETRIAL ABLATION     2011  . NO PAST SURGERIES      Family History  Problem Relation Age of Onset  . Healthy Mother   .  Hypertension Father   . Hyperlipidemia Father   . Cancer Maternal Grandmother        skin  . Cancer Maternal Grandfather        skin    Social History   Socioeconomic History  . Marital status: Married    Spouse name: Not on file  . Number of children: Not on file  . Years of education: Not on file  . Highest education level: Not on file  Occupational History  . Not on file  Tobacco Use  . Smoking status: Never Smoker  . Smokeless tobacco: Never Used  Substance and Sexual Activity  . Alcohol use: Yes    Comment: OCC  . Drug use: No  . Sexual activity: Yes  Other Topics Concern  . 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 fluticasone (FLONASE) 50 MCG/ACT nasal spray Place 2 sprays into both nostrils daily. 16 g 11   No facility-administered medications prior to visit.     EXAM:  BP 125/80   Pulse 72   Temp 97.7 F (36.5 C) (Oral)   Ht 5\' 6"  (1.676 m)   Wt 149 lb (67.6 kg)   SpO2 99%   BMI 24.05 kg/m   Body mass index is 24.05 kg/m. Wt Readings from Last 3 Encounters:  01/30/20 149 lb (67.6 kg)  01/18/19 154 lb 12.8 oz (70.2 kg)  01/13/18 148 lb 3.2 oz (67.2 kg)    Physical Exam: Vital signs reviewed 03/14/18 is a well-developed well-nourished alert cooperative    who appearsr stated age in no acute distress.  HEENT: normocephalic atraumatic , Eyes: PERRL EOM's full, conjunctiva clear, Nares: paten,t no deformity discharge or tenderness., Ears: no deformity EAC's clear TMs with normal landmarks. Mouth maksed NECK: supple  without masses, thyromegaly tiny high pitch short squeak roight upper neck ( has hx of same) CHEST/PULM:  Clear to auscultation and percussion breath sounds equal no wheeze , rales or rhonchi. No chest wall deformities or tenderness. Breast: normal by inspection . No dimpling, discharge, masses, tenderness or discharge . CV: PMI is nondisplaced, S1 S2 no gallops, murmurs, rubs. Peripheral pulses are full without delay.No JVD .  ABDOMEN: Bowel sounds normal nontender  No guard or rebound, no hepato splenomegal no CVA tenderness.  No hernia. Extremtities:  No clubbing cyanosis or edema, no acute joint swelling or redness no focal atrophy NEURO:  Oriented x3, cranial nerves 3-12 appear to be intact, no obvious focal weakness,gait within normal limits no abnormal reflexes or asymmetrical SKIN: No acute rashes normal turgor, color, no bruising or petechiae. PSYCH: Oriented, good eye contact, no obvious depression anxiety, cognition and judgment appear normal. LN: no cervical axillary inguinal adenopathy  Lab Results  Component Value Date   WBC 4.2 01/18/2019   HGB 14.1 01/18/2019   HCT 42.3 01/18/2019   PLT 306.0 01/18/2019   GLUCOSE 100 (H) 01/18/2019   CHOL 218 (H) 01/18/2019   TRIG 50.0 01/18/2019   HDL 79.50 01/18/2019   LDLCALC 128 (H) 01/18/2019   ALT 16 01/18/2019   AST 18 01/18/2019   NA 140 01/18/2019   K 4.3 01/18/2019   CL 104 01/18/2019   CREATININE 0.82 01/18/2019   BUN 15 01/18/2019   CO2 29 01/18/2019   TSH 1.11 01/18/2019    BP Readings from Last 3 Encounters:  01/30/20 125/80  01/18/19 120/62  01/13/18 122/84    Lab deom Dr 03/14/18 reviewed   ldl 131 range rest normal   ASSESSMENT AND PLAN:  Discussed the following assessment and plan:    ICD-10-CM   1. Visit for preventive health examination  Z00.00   2. Multinodular goiter (nontoxic)  E04.2     Has had lfu vaccine at work and utd on mammo and pap  No sig care gaps noted  Form signed for documentation of  yearly PV Return in about 1 year (around 01/29/2021).  Patient Care Team: Noemie Devivo, 01/31/2021, MD as PCP - General (Internal Medicine) Neta Mends, MD (Dermatology) gyne Arminda Resides, MD as Consulting Physician (Obstetrics and Gynecology) Patient Instructions   Glad you are doing well.  Continue lifestyle intervention healthy eating and exercise .  Try adding resistance exercising also .   Health Maintenance, Female Adopting a healthy lifestyle and  getting preventive care are important in promoting health and wellness. Ask your health care provider about:  The right schedule for you to have regular tests and exams.  Things you can do on your own to prevent diseases and keep yourself healthy. What should I know about diet, weight, and exercise? Eat a healthy diet  Eat a diet that includes plenty of vegetables, fruits, low-fat dairy products, and lean protein.  Do not eat a lot of foods that are high in solid fats, added sugars, or sodium.   Maintain a healthy weight Body mass index (BMI) is used to identify weight problems. It estimates body fat based on height and weight. Your health care provider can help determine your BMI and help you achieve or maintain a healthy weight. Get regular exercise Get regular exercise. This is one of the most important things you can do for your health. Most adults should:  Exercise for at least 150 minutes each week. The exercise should increase your heart rate and make you sweat (moderate-intensity exercise).  Do strengthening exercises at least twice a week. This is in addition to the moderate-intensity exercise.  Spend less time sitting. Even light physical activity can be beneficial. Watch cholesterol and blood lipids Have your blood tested for lipids and cholesterol at 53 years of age, then have this test every 5 years. Have your cholesterol levels checked more often if:  Your lipid or cholesterol levels are high.  You are older than 53  years of age.  You are at high risk for heart disease. What should I know about cancer screening? Depending on your health history and family history, you may need to have cancer screening at various ages. This may include screening for:  Breast cancer.  Cervical cancer.  Colorectal cancer.  Skin cancer.  Lung cancer. What should I know about heart disease, diabetes, and high blood pressure? Blood pressure and heart disease  High blood pressure causes heart disease and increases the risk of stroke. This is more likely to develop in people who have high blood pressure readings, are of African descent, or are overweight.  Have your blood pressure checked: ? Every 3-5 years if you are 99-27 years of age. ? Every year if you are 45 years old or older. Diabetes Have regular diabetes screenings. This checks your fasting blood sugar level. Have the screening done:  Once every three years after age 7 if you are at a normal weight and have a low risk for diabetes.  More often and at a younger age if you are overweight or have a high risk for diabetes. What should I know about preventing infection? Hepatitis B If you have a higher risk for hepatitis B, you should be screened for this virus. Talk with your health care provider to find out if you are at risk for hepatitis B infection. Hepatitis C Testing is recommended for:  Everyone born from [birth year] through 1963.  Anyone with known risk factors for hepatitis C. Sexually transmitted infections (STIs)  Get screened for STIs, including gonorrhea and chlamydia, if: ? You are sexually active and are younger than 53 years of age. ? You are older than 53 years of age and your health care provider tells you that you are at risk for this type of infection. ? Your sexual activity has changed since you were last screened, and you are at increased risk for chlamydia or gonorrhea. Ask your health care provider if you are at risk.  Ask your  health  care provider about whether you are at high risk for HIV. Your health care provider may recommend a prescription medicine to help prevent HIV infection. If you choose to take medicine to prevent HIV, you should first get tested for HIV. You should then be tested every 3 months for as long as you are taking the medicine. Pregnancy  If you are about to stop having your period (premenopausal) and you may become pregnant, seek counseling before you get pregnant.  Take 400 to 800 micrograms (mcg) of folic acid every day if you become pregnant.  Ask for birth control (contraception) if you want to prevent pregnancy. Osteoporosis and menopause Osteoporosis is a disease in which the bones lose minerals and strength with aging. This can result in bone fractures. If you are 53 years old or older, or if you are at risk for osteoporosis and fractures, ask your health care provider if you should:  Be screened for bone loss.  Take a calcium or vitamin D supplement to lower your risk of fractures.  Be given hormone replacement therapy (HRT) to treat symptoms of menopause. Follow these instructions at home: Lifestyle  Do not use any products that contain nicotine or tobacco, such as cigarettes, e-cigarettes, and chewing tobacco. If you need help quitting, ask your health care provider.  Do not use street drugs.  Do not share needles.  Ask your health care provider for help if you need support or information about quitting drugs. Alcohol use  Do not drink alcohol if: ? Your health care provider tells you not to drink. ? You are pregnant, may be pregnant, or are planning to become pregnant.  If you drink alcohol: ? Limit how much you use to 0-1 drink a day. ? Limit intake if you are breastfeeding.  Be aware of how much alcohol is in your drink. In the [place] one drink equals one 12 oz bottle of beer (355 mL), one 5 oz glass of wine (148 mL), or one 1 oz glass of hard liquor (44 mL). General  instructions  Schedule regular health, dental, and eye exams.  Stay current with your vaccines.  Tell your health care provider if: ? You often feel depressed. ? You have ever been abused or do not feel safe at home. Summary  Adopting a healthy lifestyle and getting preventive care are important in promoting health and wellness.  Follow your health care provider's instructions about healthy diet, exercising, and getting tested or screened for diseases.  Follow your health care provider's instructions on monitoring your cholesterol and blood pressure. This information is not intended to replace advice given to you by your health care provider. Make sure you discuss any questions you have with your health care provider. Document Revised: 12/16/2017 Document Reviewed: 12/16/2017 Elsevier Patient Education  2021 [employer]    RidgemarkWanda K. Jes Costales M.D.

## 2020-01-30 NOTE — Patient Instructions (Signed)
Glad you are doing well.  Continue lifestyle intervention healthy eating and exercise .  Try adding resistance exercising also .   Health Maintenance, Female Adopting a healthy lifestyle and getting preventive care are important in promoting health and wellness. Ask your health care provider about:  The right schedule for you to have regular tests and exams.  Things you can do on your own to prevent diseases and keep yourself healthy. What should I know about diet, weight, and exercise? Eat a healthy diet  Eat a diet that includes plenty of vegetables, fruits, low-fat dairy products, and lean protein.  Do not eat a lot of foods that are high in solid fats, added sugars, or sodium.   Maintain a healthy weight Body mass index (BMI) is used to identify weight problems. It estimates body fat based on height and weight. Your health care provider can help determine your BMI and help you achieve or maintain a healthy weight. Get regular exercise Get regular exercise. This is one of the most important things you can do for your health. Most adults should:  Exercise for at least 150 minutes each week. The exercise should increase your heart rate and make you sweat (moderate-intensity exercise).  Do strengthening exercises at least twice a week. This is in addition to the moderate-intensity exercise.  Spend less time sitting. Even light physical activity can be beneficial. Watch cholesterol and blood lipids Have your blood tested for lipids and cholesterol at 53 years of age, then have this test every 5 years. Have your cholesterol levels checked more often if:  Your lipid or cholesterol levels are high.  You are older than 53 years of age.  You are at high risk for heart disease. What should I know about cancer screening? Depending on your health history and family history, you may need to have cancer screening at various ages. This may include screening for:  Breast cancer.  Cervical  cancer.  Colorectal cancer.  Skin cancer.  Lung cancer. What should I know about heart disease, diabetes, and high blood pressure? Blood pressure and heart disease  High blood pressure causes heart disease and increases the risk of stroke. This is more likely to develop in people who have high blood pressure readings, are of African descent, or are overweight.  Have your blood pressure checked: ? Every 3-5 years if you are 67-85 years of age. ? Every year if you are 85 years old or older. Diabetes Have regular diabetes screenings. This checks your fasting blood sugar level. Have the screening done:  Once every three years after age 61 if you are at a normal weight and have a low risk for diabetes.  More often and at a younger age if you are overweight or have a high risk for diabetes. What should I know about preventing infection? Hepatitis B If you have a higher risk for hepatitis B, you should be screened for this virus. Talk with your health care provider to find out if you are at risk for hepatitis B infection. Hepatitis C Testing is recommended for:  Everyone born from [birth year] through 1963.  Anyone with known risk factors for hepatitis C. Sexually transmitted infections (STIs)  Get screened for STIs, including gonorrhea and chlamydia, if: ? You are sexually active and are younger than 53 years of age. ? You are older than 53 years of age and your health care provider tells you that you are at risk for this type of infection. ? Your sexual  activity has changed since you were last screened, and you are at increased risk for chlamydia or gonorrhea. Ask your health care provider if you are at risk.  Ask your health care provider about whether you are at high risk for HIV. Your health care provider may recommend a prescription medicine to help prevent HIV infection. If you choose to take medicine to prevent HIV, you should first get tested for HIV. You should then be tested every 3  months for as long as you are taking the medicine. Pregnancy  If you are about to stop having your period (premenopausal) and you may become pregnant, seek counseling before you get pregnant.  Take 400 to 800 micrograms (mcg) of folic acid every day if you become pregnant.  Ask for birth control (contraception) if you want to prevent pregnancy. Osteoporosis and menopause Osteoporosis is a disease in which the bones lose minerals and strength with aging. This can result in bone fractures. If you are 32 years old or older, or if you are at risk for osteoporosis and fractures, ask your health care provider if you should:  Be screened for bone loss.  Take a calcium or vitamin D supplement to lower your risk of fractures.  Be given hormone replacement therapy (HRT) to treat symptoms of menopause. Follow these instructions at home: Lifestyle  Do not use any products that contain nicotine or tobacco, such as cigarettes, e-cigarettes, and chewing tobacco. If you need help quitting, ask your health care provider.  Do not use street drugs.  Do not share needles.  Ask your health care provider for help if you need support or information about quitting drugs. Alcohol use  Do not drink alcohol if: ? Your health care provider tells you not to drink. ? You are pregnant, may be pregnant, or are planning to become pregnant.  If you drink alcohol: ? Limit how much you use to 0-1 drink a day. ? Limit intake if you are breastfeeding.  Be aware of how much alcohol is in your drink. In the [place] one drink equals one 12 oz bottle of beer (355 mL), one 5 oz glass of wine (148 mL), or one 1 oz glass of hard liquor (44 mL). General instructions  Schedule regular health, dental, and eye exams.  Stay current with your vaccines.  Tell your health care provider if: ? You often feel depressed. ? You have ever been abused or do not feel safe at home. Summary  Adopting a healthy lifestyle and getting  preventive care are important in promoting health and wellness.  Follow your health care provider's instructions about healthy diet, exercising, and getting tested or screened for diseases.  Follow your health care provider's instructions on monitoring your cholesterol and blood pressure. This information is not intended to replace advice given to you by your health care provider. Make sure you discuss any questions you have with your health care provider. Document Revised: 12/16/2017 Document Reviewed: 12/16/2017 Elsevier Patient Education  2021 [employer].

## 2021-03-19 ENCOUNTER — Encounter: Payer: Managed Care, Other (non HMO) | Admitting: Internal Medicine

## 2021-04-05 ENCOUNTER — Encounter: Payer: Self-pay | Admitting: Family Medicine

## 2021-04-05 ENCOUNTER — Ambulatory Visit (INDEPENDENT_AMBULATORY_CARE_PROVIDER_SITE_OTHER): Payer: 59 | Admitting: Family Medicine

## 2021-04-05 ENCOUNTER — Telehealth: Payer: Self-pay | Admitting: Internal Medicine

## 2021-04-05 VITALS — BP 142/84 | HR 81 | Temp 98.0°F | Wt 153.8 lb

## 2021-04-05 DIAGNOSIS — H1033 Unspecified acute conjunctivitis, bilateral: Secondary | ICD-10-CM | POA: Diagnosis not present

## 2021-04-05 DIAGNOSIS — R059 Cough, unspecified: Secondary | ICD-10-CM | POA: Diagnosis not present

## 2021-04-05 DIAGNOSIS — H66002 Acute suppurative otitis media without spontaneous rupture of ear drum, left ear: Secondary | ICD-10-CM

## 2021-04-05 DIAGNOSIS — H6983 Other specified disorders of Eustachian tube, bilateral: Secondary | ICD-10-CM | POA: Diagnosis not present

## 2021-04-05 LAB — POC COVID19 BINAXNOW: SARS Coronavirus 2 Ag: NEGATIVE

## 2021-04-05 LAB — POCT INFLUENZA A/B: Influenza A, POC: NEGATIVE

## 2021-04-05 MED ORDER — FLUTICASONE PROPIONATE 50 MCG/ACT NA SUSP: 1.0000 | Freq: Every day | NASAL | 0 refills | Status: DC

## 2021-04-05 MED ORDER — AMOXICILLIN-POT CLAVULANATE 500-125 MG PO TABS: 1.0000 | ORAL_TABLET | Freq: Two times a day (BID) | ORAL | 0 refills | Status: DC

## 2021-04-05 MED ORDER — POLYMYXIN B-TRIMETHOPRIM 10000-0.1 UNIT/ML-% OP SOLN: 1.0000 [drp] | OPHTHALMIC | 0 refills | Status: DC

## 2021-04-05 MED ORDER — AMOXICILLIN-POT CLAVULANATE 500-125 MG PO TABS: 1.0000 | ORAL_TABLET | Freq: Two times a day (BID) | ORAL | 0 refills | Status: AC

## 2021-04-05 NOTE — Progress Notes (Signed)
Subjective:  ? ? Patient ID: Cheryl Petersen, female    DOB: [birth year], 56 y.o.   MRN: [id number] ? ?Chief Complaint  ?Patient presents with  ? Conjunctivitis  ?  For 3 days has had eyes crusted when wakes up in morning. Ears have been been stopped up, has been losing voice on and off. Throat is not sore but glands are a little swollen. Has only been taking allegra and ibuprofen, has not had ibuprofen since this am.  ? ? ?HPI ?Patient was seen today for acute illness.  Patient endorses ears feeling stopped up, cough, laryngitis x4 days.  Patient has tried ibuprofen and cough drops for her symptoms.  Also notes eyes feeling crusted with increased drainage in the mornings.  Patient recently returned from [place] to visit her brother who was sick with a viral illness.  Patient denies nausea, vomiting, headache.  May have had fever/chills one night this week is woke up sweating. ? ?Past Medical History:  ?Diagnosis Date  ? Allergic rhinitis   ? Multinodular goiter (nontoxic)   ? benign bx in 2013   [place] 2015 and 2017  stable  ? Positive PPD   ? rx as small child neg x rays in past  ? 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 ? ?No Known Allergies ? ?ROS ?General: Denies fever, chills, night sweats, changes in weight, changes in appetite + questionable fever/chills ?HEENT: Denies headaches, ear pain, changes in vision, rhinorrhea, sore throat + hoarse voice, stopped up ears, bilateral eye drainage ?CV: Denies CP, palpitations, SOB, orthopnea ?Pulm: Denies SOB, wheezing +cough ?GI: Denies abdominal pain, nausea, vomiting, diarrhea, constipation ?GU: Denies dysuria, hematuria, frequency, vaginal discharge ?Msk: Denies muscle cramps, joint pains ?Neuro: Denies weakness, numbness, tingling ?Skin: Denies rashes, bruising ?Psych: Denies depression, anxiety, hallucinations ? ?Objective:  ?  ?Blood pressure (!) 142/84, pulse 81, temperature 98 ?F (36.7 ?C), temperature source Oral, weight 153 lb 12.8 oz (69.8 kg), SpO2 100  %. ? ?Gen. Pleasant, well-nourished, in no distress, normal affect   ?HEENT: [place]/AT, face symmetric, conjunctiva clear, no scleral icterus, PERRLA, EOMI, nares patent, slightly deviated septum, without drainage, pharynx without erythema or exudate.  Right TM full.  Left TM with erythema and suppurative fluid.  Cervical lymphadenopathy. ?Lungs: no accessory muscle use, CTAB, no wheezes or rales ?Cardiovascular: RRR, no m/r/g, no peripheral edema ?Neuro:  A&Ox3, CN II-XII intact, normal gait ?Skin:  Warm, no lesions/ rash ? ?Wt Readings from Last 3 Encounters:  ?04/05/21 153 lb 12.8 oz (69.8 kg)  ?01/30/20 149 lb (67.6 kg)  ?01/18/19 154 lb 12.8 oz (70.2 kg)  ? ? ?Lab Results  ?Component Value Date  ? WBC 4.2 01/18/2019  ? HGB 14.1 01/18/2019  ? HCT 42.3 01/18/2019  ? PLT 306.0 01/18/2019  ? GLUCOSE 100 (H) 01/18/2019  ? CHOL 218 (H) 01/18/2019  ? TRIG 50.0 01/18/2019  ? HDL 79.50 01/18/2019  ? LDLCALC 128 (H) 01/18/2019  ? ALT 16 01/18/2019  ? AST 18 01/18/2019  ? NA 140 01/18/2019  ? K 4.3 01/18/2019  ? CL 104 01/18/2019  ? CREATININE 0.82 01/18/2019  ? BUN 15 01/18/2019  ? CO2 29 01/18/2019  ? TSH 1.11 01/18/2019  ? ? ?Assessment/Plan: ? ?Acute bacterial conjunctivitis of both eyes - Plan: trimethoprim-polymyxin b (POLYTRIM) ophthalmic solution ? ?Cough, unspecified type - Plan: POC Influenza A/B, POC COVID-19 ? ?Acute suppurative otitis media of left ear without spontaneous rupture of tympanic membrane, recurrence not specified - Plan:  amoxicillin-clavulanate (AUGMENTIN) 500-125 MG tablet ? ?Dysfunction of both eustachian tubes - Plan: fluticasone (FLONASE) 50 MCG/ACT nasal spray ? ?POC COVID and influenza testing negative.  Symptoms likely 2/2 viral etiology such as acute nasopharyngitis.  Start Polytrim for conjunctivitis though likely viral.  Start Augmentin for L AOM.  Will start Flonase.  Treatment of symptoms with OTC cough/cold medications/antihistamines, steam from shower, rest, hydration, vocal rest, warm  fluids, etc. given precautions ? ?F/u as needed ? ?Abbe Amsterdam, MD ?

## 2021-04-05 NOTE — Telephone Encounter (Signed)
Patient calling in with respiratory symptoms: ?Shortness of breath, chest pain, palpitations or other red words send to Triage ? ?Does the patient have a fever over 100, cough, congestion, sore throat, runny nose, lost of taste/smell (please list symptoms that patient has)?cough, possible pink eye, ears sound stopped up ? ?What date did symptoms start?about three days ago for cough ?(If over 5 days ago, pt may be scheduled for in person visit) ? ?Have you tested for Covid in the last 5 days? No  ? ?If yes, was it positive OR negative ? If positive in the last 5 days, please schedule virtual visit now. If negative, schedule for an in person OV with the next available provider if PCP has no openings. Please also let patient know they will be tested again (follow the script below) ? ?"you will have to arrive 72mins prior to your appt time to be Covid tested. Please park in back of office at the [facility] & call [phone] to let the staff know you have arrived. A staff member will meet you at your car to do a rapid covid test. Once the test has resulted you will be notified by phone of your results to determine if appt will remain an in person visit or be converted to a virtual/phone visit. If you arrive less than 57mins before your appt time, your visit will be automatically converted to virtual & any recommended testing will happen AFTER the visit." ? ? ?THINGS TO REMEMBER ? ?If no availability for virtual visit in office,  please schedule another [place] office ? ?If no availability at another [place] office, please instruct patient that they can schedule an evisit or virtual visit through their mychart account. Visits up to 8pm ? ?patients can be seen in office 5 days after positive COVID test ? ?  ?

## 2021-04-09 ENCOUNTER — Telehealth: Payer: Self-pay | Admitting: Internal Medicine

## 2021-04-09 NOTE — Telephone Encounter (Signed)
Pt is calling and seen banks on 04-05-2021 and would like to know how long to use the eye drops also pt is on abx and would like to know about when should she have relief from ears discomfort. Pt is aware md out of office today  ?

## 2021-04-15 NOTE — Telephone Encounter (Signed)
Patient was to use eyedrops for 7 days.  If no relief with eyedrops symptoms likely 2/2 allergies.  The patient is still having ear discomfort they need to be reevaluated.  Can schedule appointment with PCP. ?

## 2021-05-02 ENCOUNTER — Other Ambulatory Visit: Payer: Self-pay | Admitting: Family Medicine

## 2021-05-02 DIAGNOSIS — H6983 Other specified disorders of Eustachian tube, bilateral: Secondary | ICD-10-CM

## 2021-07-01 ENCOUNTER — Encounter: Payer: Managed Care, Other (non HMO) | Admitting: Internal Medicine

## 2022-03-27 LAB — HEPATIC FUNCTION PANEL
ALT: 16 U/L (ref 7–35)
AST: 22 (ref 13–35)
Alkaline Phosphatase: 104 (ref 25–125)

## 2022-03-27 LAB — BASIC METABOLIC PANEL
BUN: 10 (ref 4–21)
CO2: 24 — AB (ref 13–22)
Creatinine: 0.9 (ref 0.5–1.1)
Glucose: 102
Potassium: 5.1 mEq/L (ref 3.5–5.1)
Sodium: 140 (ref 137–147)

## 2022-03-27 LAB — COMPREHENSIVE METABOLIC PANEL
Albumin: 4.5 (ref 3.5–5.0)
Calcium: 9.9 (ref 8.7–10.7)
Globulin: 2.6
eGFR: 75

## 2022-03-27 LAB — CBC AND DIFFERENTIAL
HCT: 45 (ref 36–46)
Hemoglobin: 15 (ref 12.0–16.0)
Platelets: 341 10*3/uL (ref 150–400)
WBC: 6.3

## 2022-03-27 LAB — LIPID PANEL
Cholesterol: 246 — AB (ref 0–200)
Triglycerides: 54 (ref 40–160)

## 2022-03-27 LAB — TSH: TSH: 0.79 (ref 0.41–5.90)

## 2022-03-27 LAB — HM DEXA SCAN

## 2022-03-27 LAB — VITAMIN D 25 HYDROXY (VIT D DEFICIENCY, FRACTURES): Vit D, 25-Hydroxy: 37.1

## 2022-03-27 LAB — CBC: RBC: 4.91 (ref 3.87–5.11)

## 2022-03-27 LAB — HEMOGLOBIN A1C: Hemoglobin A1C: 5.6

## 2022-04-15 ENCOUNTER — Encounter: Payer: Self-pay | Admitting: Internal Medicine

## 2022-04-15 ENCOUNTER — Ambulatory Visit (INDEPENDENT_AMBULATORY_CARE_PROVIDER_SITE_OTHER): Payer: Commercial Managed Care - PPO | Admitting: Internal Medicine

## 2022-04-15 VITALS — BP 130/76 | HR 68 | Temp 97.7°F | Ht 66.0 in | Wt 151.4 lb

## 2022-04-15 DIAGNOSIS — E7800 Pure hypercholesterolemia, unspecified: Secondary | ICD-10-CM | POA: Diagnosis not present

## 2022-04-15 DIAGNOSIS — M859 Disorder of bone density and structure, unspecified: Secondary | ICD-10-CM | POA: Diagnosis not present

## 2022-04-15 DIAGNOSIS — R238 Other skin changes: Secondary | ICD-10-CM

## 2022-04-15 DIAGNOSIS — Z23 Encounter for immunization: Secondary | ICD-10-CM

## 2022-04-15 DIAGNOSIS — Z9189 Other specified personal risk factors, not elsewhere classified: Secondary | ICD-10-CM | POA: Diagnosis not present

## 2022-04-15 NOTE — Assessment & Plan Note (Signed)
Disc  options for risk categories   Ct calcium score may help.if zero    Consider checkin lipo a at next blood test.  Ldl 156 hdl 81 tg 54 TC 246

## 2022-04-15 NOTE — Patient Instructions (Addendum)
Good to see  you today .  Will order  ct calcium score of coronary arteries self pay to help risk assessment.  If not zero consider statin medication intervention. The 10-year ASCVD risk score (Arnett DK, et al., 2019) is: 1.7%   Values used to calculate the score:     Age: 57 years     Sex: Female     Is Non-Hispanic African American: No     Diabetic: No     Tobacco smoker: No     Systolic Blood Pressure: 130 mmHg     Is BP treated: No     HDL Cholesterol: 85 mg/dL     Total Cholesterol: 249 mg/dL   Intensify lifestyle interventions.   Weight bearing exercise and resistance training .  Consider looking into osteo strong. You Vit d level was in range .  Repeat dexa in 2 years or as indicated .

## 2022-04-15 NOTE — Progress Notes (Signed)
Chief Complaint  Patient presents with   OTHER    Pt wants to discuss with provider on statin and medication for bone density.     HPI: Cheryl Petersen 57 y.o. come in for discussion of labs and dexa done at [facility] Dr Raynaldo Opitz  would like pinion advice  Lipids were again elevated and advised consider statin med .tc246 hdl 81 ldl 156  tg 54 ratio 1.9  X5M 5.6 cr .91  Cv  fam hx:  father has high lipids and . Ht and   pgf dies of mi in 40s but no other cv events      And then bone density .  Lfn-2.7 at gyne office  advised beginning actonel  Hx mng no thyroid dysfunction:  had ablation so not  known when had menopause  has hx of scoliosis but no pain problems  vit d nl  no hx fracture  mom in her mid late 90s did break a hip  Sees gyn  last pv  here 2022  ROS: See pertinent positives and negatives per HPI. 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    Family History  Problem Relation Age of Onset   Healthy Mother    Hypertension Father    Hyperlipidemia Father    Cancer Maternal Grandmother        skin   Cancer Maternal Grandfather        skin    Social History   Socioeconomic History   Marital status: Married    Spouse name: Not on file   Number of children: Not on file   Years of education: Not on file   Highest education level: Not on file  Occupational History   Not on file  Tobacco Use   Smoking status: Never   Smokeless tobacco: Never  Substance and Sexual Activity   Alcohol use: Yes    Comment: OCC   Drug use: No   Sexual activity: Yes  Other Topics Concern   Not on file  Social History Narrative   Married   Relocated from [place], [place]    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 (Patient not taking: Reported on 04/15/2022) 16 mL 0   trimethoprim-polymyxin b (POLYTRIM) ophthalmic solution Place 1 drop into both eyes every 4 (four) hours. 10 mL 0   No facility-administered medications prior to visit.     EXAM:  BP 130/76 (BP Location: Right Arm, Patient Position: Sitting, Cuff Size: Large)   Pulse 68   Temp 97.7 F (36.5 C) (Oral)   Ht 5\' 6"  (  1.676 m)   Wt 151 lb 6.4 oz (68.7 kg)   SpO2 98%   BMI 24.44 kg/m   Body mass index is 24.44 kg/m.  GENERAL: vitals reviewed and listed above, alert, oriented, appears well hydrated and in no acute distress looks well  HEENT: atraumatic, conjunctiva  clear, no obvious abnormalities on inspection of external nose and ears  MS: moves all extremities without noticeable focal  abnormality PSYCH: pleasant and cooperative, no obvious depression or anxiety Lab Results  Component Value Date   WBC 4.2 01/18/2019   HGB 14.1 01/18/2019   HCT 42.3 01/18/2019   PLT 306.0 01/18/2019   GLUCOSE 100 (H) 01/18/2019   CHOL 218 (H) 01/18/2019   TRIG 50.0 01/18/2019   HDL 79.50 01/18/2019   LDLCALC 128 (H) 01/18/2019   ALT 16 01/18/2019   AST 18 01/18/2019   NA 140 01/18/2019   K 4.3 01/18/2019   CL 104 01/18/2019   CREATININE 0.82 01/18/2019   BUN 15 01/18/2019   CO2 29 01/18/2019   TSH 1.11 01/18/2019   BP Readings from Last 3 Encounters:  04/15/22 130/76  04/05/21 (!) 142/84  01/30/20 125/80  Labs data from Dr Raynaldo Opitz reviewed to be abstracted and   scanned into record   ASSESSMENT AND PLAN:  Discussed the following assessment and plan:  Elevated LDL cholesterol level - no sx  see text - Plan: CT CARDIAC SCORING (SELF PAY ONLY)  Need for shingles vaccine - Plan: Zoster Recombinant (Shingrix )  Cardiovascular risk factor - Plan: CT CARDIAC SCORING (SELF PAY ONLY)  Low bone density for age - -2.7 rfnlfn-2.1 ap spine -1.5  Blisters of multiple sites Disc  options for risk categories   Ct calcium score may help.if zero    Consider checking lipo a at next blood test.  Reviewed dexa screening    reasonable to  intensify exercise optimize LS  : options and repeat in 2 years.  Uncertain how many years past menopause. Offered at anu time to consult with endo also  Close fu advised . For bone health  -Patient advised to return or notify health care team  if  new concerns arise.  Patient Instructions  Good to see  you today .  Will order  ct calcium score of coronary arteries self pay to help risk assessment.  If not zero consider statin medication intervention. The 10-year ASCVD risk score (Arnett DK, et al., 2019) is: 1.7%   Values used to calculate the score:     Age: 5 years     Sex: Female     Is Non-Hispanic African American: No     Diabetic: No     Tobacco smoker: No     Systolic Blood Pressure: 130 mmHg     Is BP treated: No     HDL Cholesterol: 85 mg/dL     Total Cholesterol: 249 mg/dL   Intensify lifestyle interventions.   Weight bearing exercise and resistance training .  Consider looking into osteo strong. You Vit d level was in range .  Repeat dexa in 2 years or as indicated .            Neta Mends. Akya Fiorello M.D.

## 2022-04-15 NOTE — Assessment & Plan Note (Signed)
Reviewed dexa screening    reasonable to  intensify exercise optimize LS  : options and repeat in 2 years.  Uncertain how many years past menopause. Offered at anu time to consult with endo also  Close fu advised . For bone health

## 2022-04-25 ENCOUNTER — Encounter: Payer: Self-pay | Admitting: Internal Medicine

## 2022-05-13 ENCOUNTER — Ambulatory Visit (HOSPITAL_COMMUNITY)
Admission: RE | Admit: 2022-05-13 | Discharge: 2022-05-13 | Disposition: A | Discharge status: Home / Self Care | Payer: Self-pay | Source: Ambulatory Visit | Attending: Internal Medicine | Admitting: Internal Medicine

## 2022-05-13 DIAGNOSIS — Z9189 Other specified personal risk factors, not elsewhere classified: Secondary | ICD-10-CM | POA: Insufficient documentation

## 2022-05-13 DIAGNOSIS — E7800 Pure hypercholesterolemia, unspecified: Secondary | ICD-10-CM | POA: Insufficient documentation

## 2022-05-13 NOTE — Progress Notes (Signed)
Good news  caclum score of zero   Continue lifestyle intervention healthy eating and exercise . And consider repeat in 5 years  The mildly dilated aortic root may be over read . But we should do an echo cardiogram to check this are of the artery heart to see if needs more evaluation.  K please order an echocardiogram for dx" dilated aortic root " Waiting on the overread of non heart areas

## 2022-05-14 ENCOUNTER — Other Ambulatory Visit: Payer: Self-pay

## 2022-05-14 DIAGNOSIS — I77810 Thoracic aortic ectasia: Secondary | ICD-10-CM

## 2022-05-19 NOTE — Progress Notes (Signed)
Dexa -2.7 [facility] cw OP hip

## 2022-05-26 ENCOUNTER — Ambulatory Visit: Payer: 59 | Admitting: Cardiovascular Disease

## 2022-06-12 ENCOUNTER — Other Ambulatory Visit (HOSPITAL_COMMUNITY): Payer: Commercial Managed Care - PPO

## 2022-06-26 ENCOUNTER — Ambulatory Visit (INDEPENDENT_AMBULATORY_CARE_PROVIDER_SITE_OTHER): Payer: Commercial Managed Care - PPO | Admitting: Family Medicine

## 2022-06-26 ENCOUNTER — Encounter: Payer: Self-pay | Admitting: Family Medicine

## 2022-06-26 VITALS — BP 118/78 | HR 94 | Temp 98.8°F | Wt 151.4 lb

## 2022-06-26 DIAGNOSIS — H65191 Other acute nonsuppurative otitis media, right ear: Secondary | ICD-10-CM

## 2022-06-26 DIAGNOSIS — R509 Fever, unspecified: Secondary | ICD-10-CM

## 2022-06-26 DIAGNOSIS — J029 Acute pharyngitis, unspecified: Secondary | ICD-10-CM

## 2022-06-26 LAB — POCT INFLUENZA A/B
Influenza A, POC: NEGATIVE
Influenza B, POC: NEGATIVE

## 2022-06-26 LAB — POC COVID19 BINAXNOW: SARS Coronavirus 2 Ag: NEGATIVE

## 2022-06-26 LAB — POCT RAPID STREP A (OFFICE): Rapid Strep A Screen: NEGATIVE

## 2022-06-26 MED ORDER — AMOXICILLIN-POT CLAVULANATE 500-125 MG PO TABS
1.0000 | ORAL_TABLET | Freq: Two times a day (BID) | ORAL | 0 refills | Status: AC
Start: 2022-06-26 — End: 2022-07-03

## 2022-06-26 NOTE — Progress Notes (Signed)
Established Patient Office Visit   Subjective  Patient ID: Cheryl Petersen, female    DOB: Oct 18, 1965  Age: 57 y.o. MRN: [id number]  Chief Complaint  Patient presents with   Sore Throat    Got back in town Monday night, flew. Glands feel swollen, low grade fever and sore throat for 3 days. Has been taking  ibuprofen,helps with fever.     Patient is a 57 year old female followed by Dr. Fabian Sharp and seen for acute concern.  Patient flew back into town on Monday night.  She then developed fatigue, sore throat, sore/slightly enlarged glands in neck, and low-grade fever.  Appetite is so-so.  Patient denies cough, ear pain/pressure, headache, facial pain/pressure, rhinorrhea, nausea, vomiting, loose stools.  Patient denies sick contacts.  Tried Tylenol and ibuprofen for symptoms.  Ears did not really pop well on the plane but patient did not have pain.  Sore Throat     Patient Active Problem List   Diagnosis Date Noted   Low bone density for age 66/09/2022   Elevated LDL cholesterol level 04/15/2022   Cardiovascular risk factor 04/15/2022   Multinodular goiter (nontoxic)    Positive PPD    Sun-damaged skin 07/05/2014   vascular sound assoc with respiration 08/24/2012   History of positive PPD 07/22/2011   Carotid bruit 06/03/2011   Blisters of multiple sites 05/05/2011   Thyroid nodule 01/15/2011   Visit for preventive health examination 12/10/2010   Vascular bruit 12/10/2010   Adult acne 12/10/2010   Allergic rhinitis    EUSTACHIAN TUBE DYSFUNCTION, RIGHT 10/28/2008   Past Surgical History:  Procedure Laterality Date   CESAREAN SECTION     ENDOMETRIAL ABLATION     2011   NO PAST SURGERIES     Social History   Tobacco Use   Smoking status: Never   Smokeless tobacco: Never  Substance Use Topics   Alcohol use: Yes    Comment: OCC   Drug use: No   Family History  Problem Relation Age of Onset   Healthy Mother    Hypertension Father    Hyperlipidemia Father    Cancer  Maternal Grandmother        skin   Cancer Maternal Grandfather        skin   No Known Allergies    ROS Negative unless stated above    Objective:     BP 118/78 (BP Location: Left Arm, Patient Position: Sitting, Cuff Size: Normal)   Pulse 94   Temp 98.8 F (37.1 C) (Oral)   Wt 151 lb 6.4 oz (68.7 kg)   SpO2 96%   BMI 24.44 kg/m    Physical Exam Constitutional:      General: She is not in acute distress.    Appearance: Normal appearance.  HENT:     Head: Normocephalic and atraumatic.     Right Ear: Ear canal normal. A middle ear effusion is present. Tympanic membrane is not erythematous.     Left Ear: Ear canal normal.  No middle ear effusion. Tympanic membrane is not erythematous.     Ears:     Comments: Left TM flat and dull.  R TM retracted.  Effusion noted.  No erythema.    Nose: Nose normal. No congestion or rhinorrhea.     Mouth/Throat:     Mouth: Mucous membranes are moist.     Pharynx: Uvula midline. Posterior oropharyngeal erythema present. No oropharyngeal exudate.     Tonsils: No tonsillar exudate.  Cardiovascular:  Rate and Rhythm: Normal rate and regular rhythm.     Heart sounds: Normal heart sounds. No murmur heard.    No gallop.  Pulmonary:     Effort: Pulmonary effort is normal. No respiratory distress.     Breath sounds: Normal breath sounds. No wheezing, rhonchi or rales.  Lymphadenopathy:     Cervical: Cervical adenopathy present.  Skin:    General: Skin is warm and dry.  Neurological:     Mental Status: She is alert and oriented to person, place, and time.      Results for orders placed or performed in visit on 06/26/22  POC Rapid Strep A  Result Value Ref Range   Rapid Strep A Screen Negative Negative  POC Influenza A/B  Result Value Ref Range   Influenza A, POC Negative Negative   Influenza B, POC Negative Negative  POC COVID-19  Result Value Ref Range   SARS Coronavirus 2 Ag Negative Negative      Assessment & Plan:   Acute otitis media with effusion of right ear -     Amoxicillin-Pot Clavulanate; Take 1 tablet by mouth in the morning and at bedtime for 7 days.  Dispense: 14 tablet; Refill: 0  Sore throat -     POCT rapid strep A -     POCT Influenza A/B -     POC COVID-19 BinaxNow  Low grade fever -     POCT rapid strep A -     POCT Influenza A/B -     POC COVID-19 BinaxNow  COVID, strep, flu testing negative in clinic.  Start ABX for right AOM with effusion.  Continue supportive care including antihistamine, Flonase, gargling with warm salt water or Chloraseptic spray, drinking warm fluids/hydration, Tylenol/NSAIDs.  Given precautions.  Return if symptoms worsen or fail to improve.   Deeann Saint, MD

## 2022-06-30 NOTE — Progress Notes (Signed)
[facility] following up  not sure  information communicated : coronary ct score if zero which is quite favorable . But reports  mildly dilated  aorta  uncertain significance  the radiologist by protocol advised yearly imaging ct or mri of this area . However can do an echocardiogram   which gets imaging of the aorta beginning  at the heart area .   Staff please make sure patient aware of findings and that echocard is pon schedule. Make appt virtual after all is done  or if other /s in interim

## 2022-08-05 ENCOUNTER — Ambulatory Visit (HOSPITAL_COMMUNITY)
Admission: RE | Admit: 2022-08-05 | Discharge: 2022-08-05 | Disposition: A | Discharge status: Home / Self Care | Payer: Commercial Managed Care - PPO | Source: Ambulatory Visit | Attending: Internal Medicine | Admitting: Internal Medicine

## 2022-08-05 DIAGNOSIS — I77810 Thoracic aortic ectasia: Secondary | ICD-10-CM | POA: Insufficient documentation

## 2022-08-05 LAB — ECHOCARDIOGRAM COMPLETE
Area-P 1/2: 4.17 cm2
S' Lateral: 3.2 cm

## 2022-08-14 NOTE — Progress Notes (Signed)
Echocardiogram is normal and no noted dilatation of aorta as suggested  on ct  Heart valves   look normal and heart muscle pumping is normal .

## 2023-07-29 ENCOUNTER — Other Ambulatory Visit: Payer: Self-pay

## 2023-07-29 ENCOUNTER — Encounter: Payer: Self-pay | Admitting: Orthopaedic Surgery

## 2023-07-29 ENCOUNTER — Encounter: Payer: Self-pay | Admitting: Sports Medicine

## 2023-07-29 ENCOUNTER — Ambulatory Visit (INDEPENDENT_AMBULATORY_CARE_PROVIDER_SITE_OTHER): Admitting: Sports Medicine

## 2023-07-29 ENCOUNTER — Ambulatory Visit (INDEPENDENT_AMBULATORY_CARE_PROVIDER_SITE_OTHER): Admitting: Orthopaedic Surgery

## 2023-07-29 DIAGNOSIS — G8929 Other chronic pain: Secondary | ICD-10-CM | POA: Diagnosis not present

## 2023-07-29 DIAGNOSIS — M25512 Pain in left shoulder: Secondary | ICD-10-CM

## 2023-07-29 MED ORDER — BUPIVACAINE HCL 0.25 % IJ SOLN
2.0000 mL | INTRAMUSCULAR | Status: AC | PRN
Start: 2023-07-29 — End: 2023-07-29
  Administered 2023-07-29: 2 mL via INTRA_ARTICULAR

## 2023-07-29 MED ORDER — DICLOFENAC SODIUM 75 MG PO TBEC: 75.0000 mg | DELAYED_RELEASE_TABLET | Freq: Two times a day (BID) | ORAL | 1 refills | Status: AC | PRN

## 2023-07-29 MED ORDER — LIDOCAINE HCL 1 % IJ SOLN
2.0000 mL | INTRAMUSCULAR | Status: AC | PRN
Start: 2023-07-29 — End: 2023-07-29
  Administered 2023-07-29: 2 mL

## 2023-07-29 MED ORDER — METHYLPREDNISOLONE ACETATE 40 MG/ML IJ SUSP
80.0000 mg | INTRAMUSCULAR | Status: AC | PRN
Start: 2023-07-29 — End: 2023-07-29
  Administered 2023-07-29: 80 mg via INTRA_ARTICULAR

## 2023-07-29 NOTE — Progress Notes (Signed)
 The patient is very pleasant 58 year old female who comes in with 6 months of left upper extremity pain and weakness with decreased motion.  She points to the biceps tendon and the biceps as a source of her pain however this seems to be more shoulder related on my assessment.  She has never had injury to this shoulder.  She has not take anything for inflammation or pain.  Reaching behind her is very difficult and reaching overhead is very difficult.  She has tried heat as well.  On my exam today her left shoulder shows significant stiffness with forward flexion and abduction.  Her internal rotation with adduction is also significant limited and stiff and painful.  Her right shoulder exam is entirely normal.  She is neurovascularly intact.  The biceps itself on the left side appears to be intact.  Her exam from my standpoint is more of a frozen shoulder.  She will be leaving next week for the beach and then has to go out to visit a son in college out [place] for 2 weeks.  She is someone who would definitely benefit from an intra-articular steroid injection in that left shoulder and aggressive physical therapy.  She states that she will not be back in town till after August 17.  I will send in some diclofenac  for inflammation and I have encouraged her to take this twice daily.  Today we are going to send her up to my partner Dr. Burnetta for an intra-articular steroid injection in her left shoulder joint.  She is not a diabetic.  She then needs to get back in with us  when she gets back from being out of town.  She will need aggressive physical therapy and potentially even a surgical intervention if her motion does not improve.

## 2023-07-29 NOTE — Progress Notes (Signed)
   Procedure Note  Patient: Cheryl Petersen             Date of Birth: 07-06-1965           MRN: [id number]             Visit Date: 07/29/2023  Procedures: Visit Diagnoses:  1. Chronic left shoulder pain    Large Joint Inj: L glenohumeral on 07/29/2023 11:07 AM Indications: pain Details: 22 G 3.5 in needle, ultrasound-guided posterior approach Medications: 2 mL lidocaine  1 %; 2 mL bupivacaine  0.25 %; 80 mg methylPREDNISolone  acetate 40 MG/ML Outcome: tolerated well, no immediate complications  US -guided glenohumeral joint injection, left shoulder After discussion on risks/benefits/indications, informed verbal consent was obtained. A timeout was then performed. The patient was positioned lying lateral recumbent on examination table. The patient's shoulder was prepped with betadine and multiple alcohol swabs and utilizing ultrasound guidance, the patient's glenohumeral joint was identified on ultrasound. Using ultrasound guidance a 22-gauge, 3.5 inch needle with a mixture of 2:2:2 cc's lidocaine :bupivicaine:depomedrol was directed from a lateral to medial direction via in-plane technique into the glenohumeral joint with visualization of appropriate spread of injectate into the joint. Patient tolerated the procedure well without immediate complications.      Procedure, treatment alternatives, risks and benefits explained, specific risks discussed. Consent was given by the patient. Immediately prior to procedure a time out was called to verify the correct patient, procedure, equipment, support staff and site/side marked as required. Patient was prepped and draped in the usual sterile fashion.     - patient tolerated procedure well, discussed post-injection protocol - follow-up with Dr. Vernetta as indicated; I am happy to see them as needed --> if adhesive capsulitis, could consider a 2nd HV-GHJ injection if needed and not progressing through PT  Lonell Sprang, DO Primary Care Sports Medicine  Physician  [facility]  This note was dictated using Dragon naturally speaking software and may contain errors in syntax, spelling, or content which have not been identified prior to signing this note.

## 2023-07-31 ENCOUNTER — Other Ambulatory Visit: Payer: Self-pay | Admitting: Radiology

## 2023-07-31 ENCOUNTER — Encounter: Payer: Self-pay | Admitting: Orthopaedic Surgery

## 2023-07-31 DIAGNOSIS — M7502 Adhesive capsulitis of left shoulder: Secondary | ICD-10-CM

## 2023-08-24 ENCOUNTER — Encounter: Payer: Self-pay | Admitting: Rehabilitative and Restorative Service Providers"

## 2023-08-24 ENCOUNTER — Ambulatory Visit (INDEPENDENT_AMBULATORY_CARE_PROVIDER_SITE_OTHER): Admitting: Rehabilitative and Restorative Service Providers"

## 2023-08-24 DIAGNOSIS — M25612 Stiffness of left shoulder, not elsewhere classified: Secondary | ICD-10-CM | POA: Diagnosis not present

## 2023-08-24 DIAGNOSIS — M6281 Muscle weakness (generalized): Secondary | ICD-10-CM | POA: Diagnosis not present

## 2023-08-24 DIAGNOSIS — M25512 Pain in left shoulder: Secondary | ICD-10-CM

## 2023-08-24 DIAGNOSIS — G8929 Other chronic pain: Secondary | ICD-10-CM

## 2023-08-24 DIAGNOSIS — M79622 Pain in left upper arm: Secondary | ICD-10-CM

## 2023-08-24 NOTE — Therapy (Cosign Needed)
 OUTPATIENT PHYSICAL THERAPY UPPER EXTREMITY EVALUATION   Patient Name: Cheryl Petersen MRN: [id number] DOB:[birth year], 58 y.o., female Today's Date: 08/24/2023  END OF SESSION:  PT End of Session - 08/24/23 1646     Visit Number 1    Number of Visits 20    Date for PT Re-Evaluation 11/02/23    Authorization Type [employer] UMR [id number]    PT Start Time 1100    PT Stop Time 1145    PT Time Calculation (min) 45 min    Activity Tolerance Patient tolerated treatment well;Patient limited by pain    Behavior During Therapy WFL for tasks assessed/performed          Past Medical History:  Diagnosis Date   Allergic rhinitis    Multinodular goiter (nontoxic)    benign bx in 2013   us  2015 and 2017  stable   Positive PPD    rx as small child neg x rays in past   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   Past Surgical History:  Procedure Laterality Date   CESAREAN SECTION     ENDOMETRIAL ABLATION     2011   NO PAST SURGERIES     Patient Active Problem List   Diagnosis Date Noted   Low bone density for age 75/09/2022   Elevated LDL cholesterol level 04/15/2022   Cardiovascular risk factor 04/15/2022   Multinodular goiter (nontoxic)    Positive PPD    Sun-damaged skin 07/05/2014   vascular sound assoc with respiration 08/24/2012   History of positive PPD 07/22/2011   Carotid bruit 06/03/2011   Blisters of multiple sites 05/05/2011   Thyroid  nodule 01/15/2011   Visit for preventive health examination 12/10/2010   Vascular bruit 12/10/2010   Adult acne 12/10/2010   Allergic rhinitis    EUSTACHIAN TUBE DYSFUNCTION, RIGHT 10/28/2008    PCP: Charlett Apolinar POUR, MD  REFERRING PROVIDER: Vernetta Lonni GRADE*  REFERRING DIAG: M75.02 (ICD-10-CM) - Adhesive capsulitis of left shoulder   THERAPY DIAG:  Stiffness of left shoulder, not elsewhere classified  Chronic left shoulder pain  Pain in left upper arm  Muscle weakness (generalized)  Rationale for Evaluation and  Treatment: Rehabilitation  ONSET DATE:  Referral Date: 07/31/2023, pain/stiffness started in February 2025  SUBJECTIVE:                                                                                                                                                                                      SUBJECTIVE STATEMENT:  Patient is a 58 y.o. female who presents to PT with adhesive capsulitis of Lt shoulder.  Patient reports that the pain started in February 2025  with no exact MOI.  She mentioned reaching for something on her nightstand beside the bed that might of caused it.  Specifies that the pain is in her bicep area and shoulder.  Patient recently had an injection that helped the pain but did not change the stiffness.   PERTINENT HISTORY:   PAIN:  NPRS scale: 5/10 with movement, specifically twisting, stays there for about 5 minutes and go away Pain location: biceps muscle Pain description: achy Aggravating factors: twisting, getting dressed Relieving factors: resting  PRECAUTIONS: None  WEIGHT BEARING RESTRICTIONS: No  FALLS:  Has patient fallen in last 6 months? No  LIVING ENVIRONMENT: Lives with: lives with their spouse and dog Lives in: House/apartment Stairs: Yes: Internal: 2 stories steps; can reach both and External: 3 steps; can reach both Has following equipment at home: None  OCCUPATION: retired Runner, broadcasting/film/video, part-time desk job  PLOF: Independent  PATIENT GOALS: regain motion without pain  Next MD visit: tbd  OBJECTIVE:   DIAGNOSTIC FINDINGS:  N/A  PATIENT SURVEYS:  Patient-Specific Activity Scoring Scheme  0 represents "unable to perform." 10 represents "able to perform at prior level. 0 1 2 3 4 5 6 7 8 9  10 (Date and Score)   Activity Eval     1. lifting arm above head 1     2. doors 5     3. getting dressed 5   4.    5.    Score 3.67    Total score = sum of the activity scores/number of activities Minimum detectable change (90%CI) for  average score = 2 points Minimum detectable change (90%CI) for single activity score = 3 points  COGNITION: Overall cognitive status: WFL     SENSATION: WFL  POSTURE: WFL  UPPER EXTREMITY ROM:   ROM Right eval Left Eval     Shoulder flexion  Supine A: 87 P: 102 w/ pain  Supine after manual A: 102  Seated  A: 90 w/ pain  Shoulder extension    Shoulder abduction  Supine A: 64 P: 67 w/ pain  Shoulder adduction    Shoulder internal rotation  Supine A: 27 P:38 w/ pain  Supine after manual A:50   Shoulder external rotation  Supine A: 29 P: 35 w/ pain  Elbow flexion    Elbow extension    Wrist flexion    Wrist extension    Wrist ulnar deviation    Wrist radial deviation    Wrist pronation    Wrist supination    (Blank rows = not tested)  UPPER EXTREMITY MMT:  MMT Right eval Left eval  Shoulder flexion    Shoulder extension    Shoulder abduction    Shoulder adduction    Shoulder internal rotation    Shoulder external rotation    Middle trapezius    Lower trapezius    Elbow flexion    Elbow extension    Wrist flexion    Wrist extension    Wrist ulnar deviation    Wrist radial deviation    Wrist pronation    Wrist supination    Grip strength (lbs)    (Blank rows = not tested)  SHOULDER SPECIAL TESTS:not tested  JOINT MOBILITY TESTING:  Some stiffness in the joint but discontinued further motion due to pain   PALPATION:  Pain in bicep muscle belly, mostly proximal Knots in infraspinatus muscle  TODAY'S TREATMENT:                                                                                                       DATE: Therex: HEP instruction/performance c cues for techniques, handout provided.  Trial set performed of each for comprehension and  symptom assessment.  See below for exercise list PT educated patient about how knots in the muscles can impair mobility and cause pain.  Patient verbalized understanding PT educated patient about muscle versus capsular tightness.  Patient verbalized understanding  Manual: PT physically helped release knots using constant pressure while patient performed IR/ER   PATIENT EDUCATION: Education details: HEP, POC Person educated: Patient Education method: Explanation, Demonstration, Verbal cues, and Handouts Education comprehension: verbalized understanding, returned demonstration, and verbal cues required  HOME EXERCISE PROGRAM: Access Code: [email or web address] URL: [email or web address] Date: 08/24/2023 Prepared by: Ozell Silvan   Exercises - Standing Shoulder Posterior Capsule Stretch  - 2-3 x daily - 7 x weekly - 1 sets - 5 reps - 15-30 hold - Supine Shoulder Flexion AAROM  - 2-3 x daily - 7 x weekly - 1 sets - 10 reps - 5 hold - ER doorway stretch (Mirrored)  - 2-3 x daily - 7 x weekly - 1 sets - 3-5 reps - 15-30 hold - Seated Scapular Retraction  - 2-3 x daily - 7 x weekly - 1 sets - 10 reps - 3-5 hold  ASSESSMENT:  CLINICAL IMPRESSION: Patient is a 58 y.o. who comes to clinic with complaints of Lt shoulder pain with mobility, strength and movement coordination deficits that impair their ability to perform usual daily and recreational functional activities without increase difficulty/symptoms at this time.  Patient to benefit from skilled PT services to address impairments and limitations to improve to previous level of function without restriction secondary to condition.   OBJECTIVE IMPAIRMENTS: decreased activity tolerance, decreased coordination, decreased endurance, decreased mobility, decreased ROM, decreased strength, impaired flexibility, impaired UE functional use, and pain.   ACTIVITY LIMITATIONS: carrying, lifting, bathing, dressing, self feeding, reach over head,  hygiene/grooming, and locomotion level  PARTICIPATION LIMITATIONS: meal prep, cleaning, laundry, shopping, and community activity  PERSONAL FACTORS: Fitness, Past/current experiences, and Time since onset of injury/illness/exacerbation are also affecting patient's functional outcome.   REHAB POTENTIAL: Good  CLINICAL DECISION MAKING: Stable/uncomplicated  EVALUATION COMPLEXITY: Low   GOALS: Goals reviewed with patient? Yes  SHORT TERM GOALS: (target date for Short term goals are 3 weeks 09/14/2023)  1.Patient will demonstrate independent use of home exercise program to maintain progress from in clinic treatments. Goal status: New  LONG TERM GOALS: (target dates for all long term goals are 10 weeks  11/02/2023)   1. Patient will demonstrate/report pain at worst less than or equal to 2/10 to facilitate minimal limitation in daily activity secondary to pain symptoms. Goal status: New   2. Patient will demonstrate independent use of home exercise program to facilitate ability to maintain/progress functional gains from skilled physical therapy services. Goal status: New   3. Patient will demonstrate Patient specific functional scale avg > or =  6 to indicate reduced disability due to condition.  Goal status: New   4.  Patient will demonstrate Lt shoulder MMT 5/5 throughout to facilitate lifting, reaching, carrying at [facility] in daily activity.   Goal status: New   5.  Patient will demonstrate Lt shoulder AROM WFL s symptoms to facilitate usual overhead reaching, self care, dressing at PLOF.    Goal status: New  PLAN:  PT FREQUENCY: 1-2x/week  PT DURATION: 10 weeks  PLANNED INTERVENTIONS: Can include 02853- PT Re-evaluation, 97110-Therapeutic exercises, 97530- Therapeutic activity, [id number]- Neuromuscular re-education, 97535- Self Care, 97140- Manual therapy, [phone]- Gait training, [id number]- Orthotic Fit/training, [id number]- Canalith repositioning, [id number]- Aquatic Therapy, [id number]- Electrical  stimulation (unattended), [id number] Physical performance testing, 97016- Vasopneumatic device, [id number]- Ultrasound, [id number]- Traction (mechanical), [id number]- Ionotophoresis 4mg /ml Dexamethasone,  20560 - Needle insertion w/o injection 1 or 2 muscles, 20561 - Needle insertion w/o injection 3 or more muscles.   Patient/Family education, Balance training, Stair training, Taping, Dry Needling, Joint mobilization, Joint manipulation, Spinal manipulation, Spinal mobilization, Scar mobilization, Vestibular training, Visual/preceptual remediation/compensation, DME instructions, Cryotherapy, and Moist heat.  All performed as medically necessary.  All included unless contraindicated  PLAN FOR NEXT SESSION: Review and update HEP    Ismael Nap, Student-PT 08/24/2023, 4:59 PM

## 2023-08-26 ENCOUNTER — Ambulatory Visit (INDEPENDENT_AMBULATORY_CARE_PROVIDER_SITE_OTHER)

## 2023-08-26 DIAGNOSIS — M25612 Stiffness of left shoulder, not elsewhere classified: Secondary | ICD-10-CM | POA: Diagnosis not present

## 2023-08-26 DIAGNOSIS — G8929 Other chronic pain: Secondary | ICD-10-CM

## 2023-08-26 DIAGNOSIS — M25512 Pain in left shoulder: Secondary | ICD-10-CM | POA: Diagnosis not present

## 2023-08-26 DIAGNOSIS — M79622 Pain in left upper arm: Secondary | ICD-10-CM | POA: Diagnosis not present

## 2023-08-26 DIAGNOSIS — M6281 Muscle weakness (generalized): Secondary | ICD-10-CM | POA: Diagnosis not present

## 2023-08-26 NOTE — Therapy (Signed)
 OUTPATIENT PHYSICAL THERAPY TREATMENT   Patient Name: Cheryl Petersen MRN: [id number] DOB:[birth year], 58 y.o., female Today's Date: 08/26/2023  END OF SESSION:  PT End of Session - 08/26/23 1406     Visit Number 2    Number of Visits 20    Date for PT Re-Evaluation 11/02/23    Authorization Type [employer] UMR [id number]    Authorization - Number of Visits 25    PT Start Time 0930    PT Stop Time 1010    PT Time Calculation (min) 40 min    Activity Tolerance Patient tolerated treatment well    Behavior During Therapy WFL for tasks assessed/performed           Past Medical History:  Diagnosis Date   Allergic rhinitis    Multinodular goiter (nontoxic)    benign bx in 2013   us  2015 and 2017  stable   Positive PPD    rx as small child neg x rays in past   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   Past Surgical History:  Procedure Laterality Date   CESAREAN SECTION     ENDOMETRIAL ABLATION     2011   NO PAST SURGERIES     Patient Active Problem List   Diagnosis Date Noted   Low bone density for age 79/09/2022   Elevated LDL cholesterol level 04/15/2022   Cardiovascular risk factor 04/15/2022   Multinodular goiter (nontoxic)    Positive PPD    Sun-damaged skin 07/05/2014   vascular sound assoc with respiration 08/24/2012   History of positive PPD 07/22/2011   Carotid bruit 06/03/2011   Blisters of multiple sites 05/05/2011   Thyroid  nodule 01/15/2011   Visit for preventive health examination 12/10/2010   Vascular bruit 12/10/2010   Adult acne 12/10/2010   Allergic rhinitis    EUSTACHIAN TUBE DYSFUNCTION, RIGHT 10/28/2008    PCP: Charlett Apolinar POUR, MD  REFERRING PROVIDER: Vernetta Lonni GRADE* REFERRING DIAG: M75.02 (ICD-10-CM) - Adhesive capsulitis of left shoulder   THERAPY DIAG:  Chronic left shoulder pain  Stiffness of left shoulder, not elsewhere classified  Pain in left upper arm  Muscle weakness (generalized)  Rationale for Evaluation and  Treatment: Rehabilitation  ONSET DATE:  Referral Date: 07/31/2023, pain/stiffness started in February 2025  SUBJECTIVE:                                                                                                                                                                                      SUBJECTIVE STATEMENT: Patient reports moving the shoulder is feeling better. PERTINENT HISTORY:   PAIN:  NPRS scale: 5/10 with reaching out and up Pain  location: biceps muscle Pain description: achy Aggravating factors: twisting, getting dressed Relieving factors: resting  PRECAUTIONS: None  WEIGHT BEARING RESTRICTIONS: No  FALLS:  Has patient fallen in last 6 months? No  LIVING ENVIRONMENT: Lives with: lives with their spouse and dog Lives in: House/apartment Stairs: Yes: Internal: 2 stories steps; can reach both and External: 3 steps; can reach both Has following equipment at home: None  OCCUPATION: retired Runner, broadcasting/film/video, part-time desk job  PLOF: Independent  PATIENT GOALS: regain motion without pain  Next MD visit: tbd  OBJECTIVE:   DIAGNOSTIC FINDINGS:  N/A  PATIENT SURVEYS:  Patient-Specific Activity Scoring Scheme  0 represents "unable to perform." 10 represents "able to perform at prior level. 0 1 2 3 4 5 6 7 8 9  10 (Date and Score)   Activity Eval  08/24/2023    1. lifting arm above head 1     2. doors 5     3. getting dressed 5   4.    5.    Score 3.67    Total score = sum of the activity scores/number of activities Minimum detectable change (90%CI) for average score = 2 points Minimum detectable change (90%CI) for single activity score = 3 points  COGNITION: 08/24/2023 Overall cognitive status: WFL     SENSATION: 08/24/2023 WFL  POSTURE: 08/24/2023 [facility]  UPPER EXTREMITY ROM:   ROM Right Eval 08/24/2023 Left Eval 08/24/2023     Shoulder flexion  Supine A: 87 P: 102 w/ pain  Supine after manual A: 102  Seated  A: 90 w/ pain   Shoulder extension    Shoulder abduction  Supine A: 64 P: 67 w/ pain  Shoulder adduction    Shoulder internal rotation  Supine A: 27 P:38 w/ pain  Supine after manual A:50   Shoulder external rotation  Supine A: 29 P: 35 w/ pain  Elbow flexion    Elbow extension    Wrist flexion    Wrist extension    Wrist ulnar deviation    Wrist radial deviation    Wrist pronation    Wrist supination    (Blank rows = not tested)  UPPER EXTREMITY MMT:  MMT Right eval Left eval  Shoulder flexion    Shoulder extension    Shoulder abduction    Shoulder adduction    Shoulder internal rotation    Shoulder external rotation    Middle trapezius    Lower trapezius    Elbow flexion    Elbow extension    Wrist flexion    Wrist extension    Wrist ulnar deviation    Wrist radial deviation    Wrist pronation    Wrist supination    Grip strength (lbs)    (Blank rows = not tested)  SHOULDER SPECIAL TESTS: 08/24/2023 not tested  JOINT MOBILITY TESTING:  08/24/2023 Some stiffness in the joint but discontinued further motion due to pain   PALPATION:  08/24/2023 Pain in bicep muscle belly, mostly proximal Knots in infraspinatus muscle  TODAY'S TREATMENT:                                                                                                       DATE:  08/26/23 There ex- Review of HEP Supine wand 5 sec hold Supine chest press 0# wand   Neuro re ed- Pulleys 3 min  Shrugs and Retractions 20x each   08/24/2023 Therex: HEP instruction/performance c cues for techniques, handout provided.  Trial set performed of each for comprehension and symptom assessment.  See below for exercise list PT educated patient about how knots in the muscles can impair mobility and cause pain.  Patient  verbalized understanding PT educated patient about muscle versus capsular tightness.  Patient verbalized understanding  Manual: Compression with active movement to trigger points in Lt infraspinatus.  G2 inferior glides in flexion, scaption.  Mobilization with movement posterior glide c passive ER to tolerance.    PATIENT EDUCATION: 08/24/2023 Education details: HEP, POC Person educated: Patient Education method: Programmer, multimedia, Demonstration, Verbal cues, and Handouts Education comprehension: verbalized understanding, returned demonstration, and verbal cues required  HOME EXERCISE PROGRAM: Access Code: [email or web address] URL: [email or web address] Date: 08/24/2023 Prepared by: Ozell Silvan   Exercises - Standing Shoulder Posterior Capsule Stretch  - 2-3 x daily - 7 x weekly - 1 sets - 5 reps - 15-30 hold - Supine Shoulder Flexion AAROM  - 2-3 x daily - 7 x weekly - 1 sets - 10 reps - 5 hold - ER doorway stretch (Mirrored)  - 2-3 x daily - 7 x weekly - 1 sets - 3-5 reps - 15-30 hold - Seated Scapular Retraction  - 2-3 x daily - 7 x weekly - 1 sets - 10 reps - 3-5 hold  ASSESSMENT:  CLINICAL IMPRESSION: Pt needed adjustments with doorway stretch due to pain in positioning. Demonstrated understanding.  OBJECTIVE IMPAIRMENTS: decreased activity tolerance, decreased coordination, decreased endurance, decreased mobility, decreased ROM, decreased strength, impaired flexibility, impaired UE functional use, and pain.   ACTIVITY LIMITATIONS: carrying, lifting, bathing, dressing, self feeding, reach over head, hygiene/grooming, and locomotion level  PARTICIPATION LIMITATIONS: meal prep, cleaning, laundry, shopping, and community activity  PERSONAL FACTORS: Fitness, Past/current experiences, and Time since onset of injury/illness/exacerbation are also affecting patient's functional outcome.   REHAB POTENTIAL: Good  CLINICAL DECISION MAKING: Stable/uncomplicated  EVALUATION  COMPLEXITY: Low   GOALS: Goals reviewed with patient? Yes  SHORT TERM GOALS: (target date for Short term goals are 3 weeks 09/14/2023)  1.Patient will demonstrate independent use of home exercise program to maintain progress from in clinic treatments. Goal status: New  LONG TERM GOALS: (target dates for all long term goals are 10 weeks  11/02/2023)   1. Patient will demonstrate/report pain at worst less than or equal to 2/10 to facilitate minimal limitation in daily activity secondary to pain symptoms. Goal status: New   2. Patient will demonstrate independent use of home exercise program to facilitate ability to maintain/progress functional gains from skilled physical therapy services. Goal status: New   3. Patient will demonstrate Patient specific functional scale avg > or = 8  to indicate reduced disability due to condition.  Goal status: New   4.  Patient will demonstrate Lt shoulder MMT 5/5 throughout to facilitate lifting, reaching, carrying at [facility] in daily activity.   Goal status: New   5.  Patient will demonstrate Lt shoulder AROM WFL s symptoms to facilitate usual overhead reaching, self care, dressing at PLOF.    Goal status: New  PLAN:  PT FREQUENCY: 1-2x/week  PT DURATION: 10 weeks  PLANNED INTERVENTIONS: Can include 02853- PT Re-evaluation, 97110-Therapeutic exercises, 97530- Therapeutic activity, [id number]- Neuromuscular re-education, 97535- Self Care, 97140- Manual therapy, [phone]- Gait training, [phone]- Orthotic Fit/training, [phone]- Canalith repositioning, [id number]- Aquatic Therapy, [phone]- Electrical stimulation (unattended), [id number] Physical performance testing, 97016- Vasopneumatic device, [id number]- Ultrasound, [id number]- Traction (mechanical), [id number]- Ionotophoresis 4mg /ml Dexamethasone,  20560 - Needle insertion w/o injection 1 or 2 muscles, 20561 - Needle insertion w/o injection 3 or more muscles.   Patient/Family education, Balance training, Stair training, Taping, Dry  Needling, Joint mobilization, Joint manipulation, Spinal manipulation, Spinal mobilization, Scar mobilization, Vestibular training, Visual/preceptual remediation/compensation, DME instructions, Cryotherapy, and Moist heat.  All performed as medically necessary.  All included unless contraindicated  PLAN FOR NEXT SESSION: Start on UBE.   Burnard Meth, PT 08/26/23  2:08 PM

## 2023-09-03 ENCOUNTER — Ambulatory Visit (INDEPENDENT_AMBULATORY_CARE_PROVIDER_SITE_OTHER): Admitting: Rehabilitative and Restorative Service Providers"

## 2023-09-03 ENCOUNTER — Encounter: Payer: Self-pay | Admitting: Rehabilitative and Restorative Service Providers"

## 2023-09-03 ENCOUNTER — Encounter

## 2023-09-03 DIAGNOSIS — M25512 Pain in left shoulder: Secondary | ICD-10-CM | POA: Diagnosis not present

## 2023-09-03 DIAGNOSIS — M25612 Stiffness of left shoulder, not elsewhere classified: Secondary | ICD-10-CM

## 2023-09-03 DIAGNOSIS — M79622 Pain in left upper arm: Secondary | ICD-10-CM

## 2023-09-03 DIAGNOSIS — G8929 Other chronic pain: Secondary | ICD-10-CM

## 2023-09-03 DIAGNOSIS — M6281 Muscle weakness (generalized): Secondary | ICD-10-CM | POA: Diagnosis not present

## 2023-09-03 NOTE — Therapy (Signed)
 OUTPATIENT PHYSICAL THERAPY TREATMENT   Patient Name: Cheryl Petersen MRN: [id number] DOB:03-Nov-1965, 58 y.o., female Today's Date: 09/03/2023  END OF SESSION:  PT End of Session - 09/03/23 0839     Visit Number 3    Number of Visits 20    Date for PT Re-Evaluation 11/02/23    Authorization Type [employer] UMR [id number]    Authorization - Visit Number 3    Authorization - Number of Visits 25    Progress Note Due on Visit 10    PT Start Time 0839    PT Stop Time 0919    PT Time Calculation (min) 40 min    Activity Tolerance Patient tolerated treatment well    Behavior During Therapy [facility] for tasks assessed/performed            Past Medical History:  Diagnosis Date   Allergic rhinitis    Multinodular goiter (nontoxic)    benign bx in 2013   us  2015 and 2017  stable   Positive PPD    rx as small child neg x rays in past   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   Past Surgical History:  Procedure Laterality Date   CESAREAN SECTION     ENDOMETRIAL ABLATION     2011   NO PAST SURGERIES     Patient Active Problem List   Diagnosis Date Noted   Low bone density for age 31/09/2022   Elevated LDL cholesterol level 04/15/2022   Cardiovascular risk factor 04/15/2022   Multinodular goiter (nontoxic)    Positive PPD    Sun-damaged skin 07/05/2014   vascular sound assoc with respiration 08/24/2012   History of positive PPD 07/22/2011   Carotid bruit 06/03/2011   Blisters of multiple sites 05/05/2011   Thyroid  nodule 01/15/2011   Visit for preventive health examination 12/10/2010   Vascular bruit 12/10/2010   Adult acne 12/10/2010   Allergic rhinitis    EUSTACHIAN TUBE DYSFUNCTION, RIGHT 10/28/2008    PCP: Charlett Apolinar POUR, MD  REFERRING PROVIDER: Vernetta Lonni GRADE* REFERRING DIAG: M75.02 (ICD-10-CM) - Adhesive capsulitis of left shoulder   THERAPY DIAG:  Chronic left shoulder pain  Stiffness of left shoulder, not elsewhere classified  Pain in left upper  arm  Muscle weakness (generalized)  Rationale for Evaluation and Treatment: Rehabilitation  ONSET DATE:  Referral Date: 07/31/2023, pain/stiffness started in February 2025  SUBJECTIVE:                                                                                                                                                                                      SUBJECTIVE STATEMENT: Pt indicated noticing improvement in  symptoms for pain in Lt shoulder, reporting no pain over last few days.  Indicated about 50% improvement to normal at this time.   PERTINENT HISTORY:   PAIN:  NPRS scale: 0/10 Pain location: biceps muscle Pain description: achy Aggravating factors: twisting, getting dressed Relieving factors: resting  PRECAUTIONS: None  WEIGHT BEARING RESTRICTIONS: No  FALLS:  Has patient fallen in last 6 months? No  LIVING ENVIRONMENT: Lives with: lives with their spouse and dog Lives in: House/apartment Stairs: Yes: Internal: 2 stories steps; can reach both and External: 3 steps; can reach both Has following equipment at home: None  OCCUPATION: retired Runner, broadcasting/film/video, part-time desk job  PLOF: Independent  PATIENT GOALS: regain motion without pain  Next MD visit: tbd  OBJECTIVE:   DIAGNOSTIC FINDINGS:  N/A  PATIENT SURVEYS:  Patient-Specific Activity Scoring Scheme  0 represents "unable to perform." 10 represents "able to perform at prior level. 0 1 2 3 4 5 6 7 8 9  10 (Date and Score)   Activity Eval  08/24/2023    1. lifting arm above head 1     2. doors 5     3. getting dressed 5   4.    5.    Score 3.67    Total score = sum of the activity scores/number of activities Minimum detectable change (90%CI) for average score = 2 points Minimum detectable change (90%CI) for single activity score = 3 points  COGNITION: 08/24/2023 Overall cognitive status: WFL     SENSATION: 08/24/2023 WFL  POSTURE: 08/24/2023 [facility]  UPPER EXTREMITY ROM:   ROM  Right Eval 08/24/2023 Left Eval 08/24/2023    Left 09/03/2023 Supine AROM  Shoulder flexion  Supine A: 87 P: 102 w/ pain  Supine after manual A: 102  Seated  A: 90 w/ pain 122 AROM  Shoulder extension     Shoulder abduction  Supine A: 64 P: 67 w/ pain 90 AROM  Shoulder adduction     Shoulder internal rotation  Supine A: 27 P:38 w/ pain  Supine after manual A:50    Shoulder external rotation  Supine A: 29 P: 35 w/ pain 45 AROM in supine 45 deg abduction  Elbow flexion     Elbow extension     Wrist flexion     Wrist extension     Wrist ulnar deviation     Wrist radial deviation     Wrist pronation     Wrist supination     (Blank rows = not tested)  UPPER EXTREMITY MMT:  MMT Right  Left 09/03/2023  Shoulder flexion  4+/5  Shoulder extension    Shoulder abduction  3+/5  Shoulder adduction    Shoulder internal rotation  5/5  Shoulder external rotation  4/5  Middle trapezius    Lower trapezius    Elbow flexion    Elbow extension    Wrist flexion    Wrist extension    Wrist ulnar deviation    Wrist radial deviation    Wrist pronation    Wrist supination    Grip strength (lbs)    (Blank rows = not tested)  SHOULDER SPECIAL TESTS: 08/24/2023 not tested  JOINT MOBILITY TESTING:  08/24/2023 Some stiffness in the joint but discontinued further motion due to pain   PALPATION:  08/24/2023 Pain in bicep muscle belly, mostly proximal Knots in infraspinatus muscle  TODAY'S TREATMENT:                                                                                                       DATE:  09/03/2023 Manual: Lt shoulder distraction with passive range movement, g3 inferior glides in flexion,scaption, abduction.  Posterior GH jt glide mobilization with movement with  passive ER.    Therex: Sidelying Lt shoulder ER c towel under arm 1 lb x 30,  Review of additions to HEP. Handout provided.    TherActivity (to improve functional reach, push/ pull) UBE UE only lvl 2.5 3 mins each way fwd/back with 1 min rest between directions Supine AAROM wand flexion 2-3 sec hold x 15 to promote elevation movements Supine Lt shoulder flexion 1 lb weight x 30  to promote elevation movements Sidelying Lt shoulder abduction 1 lb 2 x 15 to promote elevation movements Standing wall slide flexion x 10       TODAY'S TREATMENT:                                                                                                       DATE: 08/26/23 There ex- Review of HEP Supine wand 5 sec hold Supine chest press 0# wand   Neuro re ed- Pulleys 3 min  Shrugs and Retractions 20x each   TODAY'S TREATMENT:                                                                                                       DATE: 08/24/2023 Therex: HEP instruction/performance c cues for techniques, handout provided.  Trial set performed of each for comprehension and symptom assessment.  See below for exercise list PT educated patient about how knots in the muscles can impair mobility and cause pain.  Patient verbalized understanding PT educated patient about muscle versus capsular tightness.  Patient verbalized understanding  Manual: Compression with active movement to trigger points in Lt infraspinatus.  G2 inferior glides in flexion, scaption.  Mobilization with movement posterior glide c passive ER to tolerance.    PATIENT EDUCATION: 08/24/2023 Education details: HEP, POC Person educated: Patient Education method: Explanation, Demonstration, Verbal cues, and Handouts Education comprehension: verbalized understanding, returned demonstration, and verbal cues required  HOME EXERCISE PROGRAM:  Access Code: [email or web address] URL: [email or web address] Date: 09/03/2023 Prepared by:  Ozell Silvan  Exercises - Standing Shoulder Posterior Capsule Stretch  - 2-3 x daily - 7 x weekly - 1 sets - 5 reps - 15-30 hold - ER doorway stretch (Mirrored)  - 2-3 x daily - 7 x weekly - 1 sets - 3-5 reps - 15-30 hold - Seated Scapular Retraction  - 2-3 x daily - 7 x weekly - 1 sets - 10 reps - 3-5 hold - Standing shoulder flexion wall slides  - 2-3 x daily - 7 x weekly - 1 sets - 10 reps - 5 hold - Supine Shoulder Flexion Extension AAROM with Dowel  - 2-3 x daily - 7 x weekly - 1-2 sets - 10-15 reps - 3 hold - Supine Shoulder Flexion Extension Full Range AROM (Mirrored)  - 1-2 x daily - 7 x weekly - 1-2 sets - 10-20 reps - Sidelying Shoulder Abduction Palm Forward (Mirrored)  - 1-2 x daily - 7 x weekly - 2-3 sets - 10-15 reps - Sidelying Shoulder External Rotation Dumbbell (Mirrored)  - 1 x daily - 7 x weekly - 3 sets - 10 reps  ASSESSMENT:  CLINICAL IMPRESSION: Range of motion showed improvement compared to eval.  Noted some mild tightness in capsule with end range gain progression since eval.  Continued skilled PT services indicated at this time to progress mobility and strength towards goals.   OBJECTIVE IMPAIRMENTS: decreased activity tolerance, decreased coordination, decreased endurance, decreased mobility, decreased ROM, decreased strength, impaired flexibility, impaired UE functional use, and pain.   ACTIVITY LIMITATIONS: carrying, lifting, bathing, dressing, self feeding, reach over head, hygiene/grooming, and locomotion level  PARTICIPATION LIMITATIONS: meal prep, cleaning, laundry, shopping, and community activity  PERSONAL FACTORS: Fitness, Past/current experiences, and Time since onset of injury/illness/exacerbation are also affecting patient's functional outcome.   REHAB POTENTIAL: Good  CLINICAL DECISION MAKING: Stable/uncomplicated  EVALUATION COMPLEXITY: Low   GOALS: Goals reviewed with patient? Yes  SHORT TERM GOALS: (target date for Short term goals are 3  weeks 09/14/2023)  1.Patient will demonstrate independent use of home exercise program to maintain progress from in clinic treatments. Goal status: Met   LONG TERM GOALS: (target dates for all long term goals are 10 weeks  11/02/2023)   1. Patient will demonstrate/report pain at worst less than or equal to 2/10 to facilitate minimal limitation in daily activity secondary to pain symptoms. Goal status: on going 09/03/2023   2. Patient will demonstrate independent use of home exercise program to facilitate ability to maintain/progress functional gains from skilled physical therapy services. Goal status: on going 09/03/2023   3. Patient will demonstrate Patient specific functional scale avg > or = 8 to indicate reduced disability due to condition.  Goal status: on going 09/03/2023   4.  Patient will demonstrate Lt shoulder MMT 5/5 throughout to facilitate lifting, reaching, carrying at [facility] in daily activity.   Goal status: on going 09/03/2023   5.  Patient will demonstrate Lt shoulder AROM WFL s symptoms to facilitate usual overhead reaching, self care, dressing at PLOF.    Goal status: on going 09/03/2023  PLAN:  PT FREQUENCY: 1-2x/week  PT DURATION: 10 weeks  PLANNED INTERVENTIONS: Can include 02853- PT Re-evaluation, 97110-Therapeutic exercises, 97530- Therapeutic activity, [id number]- Neuromuscular re-education, 97535- Self Care, 97140- Manual therapy, [id number]- Gait training, [phone]- Orthotic Fit/training, [phone]- Canalith repositioning, [id number]- Aquatic Therapy, H9716- Electrical stimulation (unattended), [id number] Physical performance testing, 97016- Vasopneumatic device, [id number]- Ultrasound, 02987- Traction (mechanical), [id number]- Ionotophoresis  4mg /ml Dexamethasone,  20560 - Needle insertion w/o injection 1 or 2 muscles, 20561 - Needle insertion w/o injection 3 or more muscles.   Patient/Family education, Balance training, Stair training, Taping, Dry Needling, Joint mobilization, Joint manipulation,  Spinal manipulation, Spinal mobilization, Scar mobilization, Vestibular training, Visual/preceptual remediation/compensation, DME instructions, Cryotherapy, and Moist heat.  All performed as medically necessary.  All included unless contraindicated  PLAN FOR NEXT SESSION:  Continue range gains and functional strengthening as able.    Ozell Silvan, PT, DPT, OCS, ATC 09/03/23  9:18 AM

## 2023-09-09 ENCOUNTER — Encounter

## 2023-09-10 ENCOUNTER — Ambulatory Visit (INDEPENDENT_AMBULATORY_CARE_PROVIDER_SITE_OTHER): Admitting: Rehabilitative and Restorative Service Providers"

## 2023-09-10 ENCOUNTER — Encounter: Payer: Self-pay | Admitting: Rehabilitative and Restorative Service Providers"

## 2023-09-10 DIAGNOSIS — M25512 Pain in left shoulder: Secondary | ICD-10-CM

## 2023-09-10 DIAGNOSIS — M25612 Stiffness of left shoulder, not elsewhere classified: Secondary | ICD-10-CM | POA: Diagnosis not present

## 2023-09-10 DIAGNOSIS — G8929 Other chronic pain: Secondary | ICD-10-CM

## 2023-09-10 DIAGNOSIS — M6281 Muscle weakness (generalized): Secondary | ICD-10-CM | POA: Diagnosis not present

## 2023-09-10 DIAGNOSIS — M79622 Pain in left upper arm: Secondary | ICD-10-CM

## 2023-09-10 NOTE — Therapy (Addendum)
 OUTPATIENT PHYSICAL THERAPY TREATMENT / DISCHARGE   Patient Name: Cheryl Petersen MRN: [id number] DOB:05/29/1965, 58 y.o., female Today's Date: 09/10/2023  END OF SESSION:  PT End of Session - 09/10/23 1104     Visit Number 4    Number of Visits 20    Date for PT Re-Evaluation 11/02/23    Authorization Type [employer] [id number] [id number]    Authorization - Visit Number 4    Authorization - Number of Visits 25    Progress Note Due on Visit 10    PT Start Time 1102    PT Stop Time 1142    PT Time Calculation (min) 40 min    Activity Tolerance Patient tolerated treatment well    Behavior During Therapy WFL for tasks assessed/performed             Past Medical History:  Diagnosis Date   Allergic rhinitis    Multinodular goiter (nontoxic)    benign bx in 2013   us  2015 and 2017  stable   Positive PPD    rx as small child neg x rays in past   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   Past Surgical History:  Procedure Laterality Date   CESAREAN SECTION     ENDOMETRIAL ABLATION     2011   NO PAST SURGERIES     Patient Active Problem List   Diagnosis Date Noted   Low bone density for age 78/09/2022   Elevated LDL cholesterol level 04/15/2022   Cardiovascular risk factor 04/15/2022   Multinodular goiter (nontoxic)    Positive PPD    Sun-damaged skin 07/05/2014   vascular sound assoc with respiration 08/24/2012   History of positive PPD 07/22/2011   Carotid bruit 06/03/2011   Blisters of multiple sites 05/05/2011   Thyroid  nodule 01/15/2011   Visit for preventive health examination 12/10/2010   Vascular bruit 12/10/2010   Adult acne 12/10/2010   Allergic rhinitis    EUSTACHIAN TUBE DYSFUNCTION, RIGHT 10/28/2008    PCP: Charlett Apolinar POUR, MD  REFERRING PROVIDER: Vernetta Lonni GRADE* REFERRING DIAG: M75.02 (ICD-10-CM) - Adhesive capsulitis of left shoulder   THERAPY DIAG:  Chronic left shoulder pain  Stiffness of left shoulder, not elsewhere classified  Pain in left  upper arm  Muscle weakness (generalized)  Rationale for Evaluation and Treatment: Rehabilitation  ONSET DATE:  Referral Date: 07/31/2023, pain/stiffness started in February 2025  SUBJECTIVE:                                                                                                                                                                                      SUBJECTIVE STATEMENT: Pt. Indicated  getting improvement in mobility and report no pain today.    PERTINENT HISTORY:   PAIN:  NPRS scale: 0/10 Pain location: biceps muscle Pain description: achy Aggravating factors: twisting, getting dressed Relieving factors: resting  PRECAUTIONS: None  WEIGHT BEARING RESTRICTIONS: No  FALLS:  Has patient fallen in last 6 months? No  LIVING ENVIRONMENT: Lives with: lives with their spouse and dog Lives in: House/apartment Stairs: Yes: Internal: 2 stories steps; can reach both and External: 3 steps; can reach both Has following equipment at home: None  OCCUPATION: retired Runner, broadcasting/film/video, part-time desk job  PLOF: Independent  PATIENT GOALS: regain motion without pain  Next MD visit: tbd  OBJECTIVE:   DIAGNOSTIC FINDINGS:  N/A  PATIENT SURVEYS:  Patient-Specific Activity Scoring Scheme  0 represents "unable to perform." 10 represents "able to perform at prior level. 0 1 2 3 4 5 6 7 8 9  10 (Date and Score)   Activity Eval  08/24/2023    1. lifting arm above head 1     2. doors 5     3. getting dressed 5   4.    5.    Score 3.67    Total score = sum of the activity scores/number of activities Minimum detectable change (90%CI) for average score = 2 points Minimum detectable change (90%CI) for single activity score = 3 points  COGNITION: 08/24/2023 Overall cognitive status: WFL     SENSATION: 08/24/2023 WFL  POSTURE: 08/24/2023 [facility]  UPPER EXTREMITY ROM:   ROM Right Eval 08/24/2023 Left Eval 08/24/2023    Left 09/03/2023 Supine AROM  Left 09/10/2023  Shoulder flexion  Supine A: 87 P: 102 w/ pain  Supine after manual A: 102  Seated  A: 90 w/ pain 122 AROM   Shoulder extension      Shoulder abduction  Supine A: 64 P: 67 w/ pain 90 AROM   Shoulder adduction      Shoulder internal rotation  Supine A: 27 P:38 w/ pain  Supine after manual A:50     Shoulder external rotation  Supine A: 29 P: 35 w/ pain 45 AROM in supine 45 deg abduction   Elbow flexion      Elbow extension      Wrist flexion      Wrist extension                        HBB    L4   (Blank rows = not tested)  UPPER EXTREMITY MMT:  MMT Right  Left 09/03/2023  Shoulder flexion  4+/5  Shoulder extension    Shoulder abduction  3+/5  Shoulder adduction    Shoulder internal rotation  5/5  Shoulder external rotation  4/5  Middle trapezius    Lower trapezius    Elbow flexion    Elbow extension    Wrist flexion    Wrist extension    Wrist ulnar deviation    Wrist radial deviation    Wrist pronation    Wrist supination    Grip strength (lbs)    (Blank rows = not tested)  SHOULDER SPECIAL TESTS: 08/24/2023 not tested  JOINT MOBILITY TESTING:  08/24/2023 Some stiffness in the joint but discontinued further motion due to pain   PALPATION:  08/24/2023 Pain in bicep muscle belly, mostly proximal Knots in infraspinatus muscle  TODAY'S TREATMENT:                                                                                                       DATE:  09/10/2023 Therex: Standing Lt shoulder ER with towel under arm green band 2 x 10  IR rope stretch 15 sec x 5 with Lt arm   Neuro Re-ed Tband green rows c scapular retraction 2 x 15 Tband GH ext green band 2 x 15    TherActivity (to improve functional reach, push/ pull) UBE UE only lvl 2.5 3  mins each way fwd/back with 1 min rest between directions Seated pulley with eccentric lowering focus with Lt arm 3 mins flexion, 3 mins scaption UE ranger assisted flexion reach with ipsilateral step in x 15    TODAY'S TREATMENT:                                                                                                       DATE:  09/03/2023 Manual: Lt shoulder distraction with passive range movement, g3 inferior glides in flexion,scaption, abduction.  Posterior GH jt glide mobilization with movement with passive ER.    Therex: Sidelying Lt shoulder ER c towel under arm 1 lb x 30,  Review of additions to HEP. Handout provided.    TherActivity (to improve functional reach, push/ pull) UBE UE only lvl 2.5 3 mins each way fwd/back with 1 min rest between directions Supine AAROM wand flexion 2-3 sec hold x 15 to promote elevation movements Supine Lt shoulder flexion 1 lb weight x 30  to promote elevation movements Sidelying Lt shoulder abduction 1 lb 2 x 15 to promote elevation movements Standing wall slide flexion x 10    TODAY'S TREATMENT:                                                                                                       DATE: 08/26/23 There ex- Review of HEP Supine wand 5 sec hold Supine chest press 0# wand   Neuro re ed- Pulleys 3 min  Shrugs and Retractions 20x each   TODAY'S TREATMENT:  DATE: 08/24/2023 Therex: HEP instruction/performance c cues for techniques, handout provided.  Trial set performed of each for comprehension and symptom assessment.  See below for exercise list PT educated patient about how knots in the muscles can impair mobility and cause pain.  Patient verbalized understanding PT educated patient about muscle versus capsular tightness.  Patient verbalized understanding  Manual: Compression with active movement to trigger points in Lt  infraspinatus.  G2 inferior glides in flexion, scaption.  Mobilization with movement posterior glide c passive ER to tolerance.    PATIENT EDUCATION: 08/24/2023 Education details: HEP, POC Person educated: Patient Education method: Programmer, multimedia, Demonstration, Verbal cues, and Handouts Education comprehension: verbalized understanding, returned demonstration, and verbal cues required  HOME EXERCISE PROGRAM: Access Code: [email or web address] URL: [email or web address] Date: 09/03/2023 Prepared by: Ozell Silvan  Exercises - Standing Shoulder Posterior Capsule Stretch  - 2-3 x daily - 7 x weekly - 1 sets - 5 reps - 15-30 hold - ER doorway stretch (Mirrored)  - 2-3 x daily - 7 x weekly - 1 sets - 3-5 reps - 15-30 hold - Seated Scapular Retraction  - 2-3 x daily - 7 x weekly - 1 sets - 10 reps - 3-5 hold - Standing shoulder flexion wall slides  - 2-3 x daily - 7 x weekly - 1 sets - 10 reps - 5 hold - Supine Shoulder Flexion Extension AAROM with Dowel  - 2-3 x daily - 7 x weekly - 1-2 sets - 10-15 reps - 3 hold - Supine Shoulder Flexion Extension Full Range AROM (Mirrored)  - 1-2 x daily - 7 x weekly - 1-2 sets - 10-20 reps - Sidelying Shoulder Abduction Palm Forward (Mirrored)  - 1-2 x daily - 7 x weekly - 2-3 sets - 10-15 reps - Sidelying Shoulder External Rotation Dumbbell (Mirrored)  - 1 x daily - 7 x weekly - 3 sets - 10 reps  ASSESSMENT:  CLINICAL IMPRESSION: Continued positive gains in Lt shoulder mobility and ability at this time.  Continued transitioning with increased strengthening and motor control activity.  Continued progression in mobility stretching and strengthening indicated.   OBJECTIVE IMPAIRMENTS: decreased activity tolerance, decreased coordination, decreased endurance, decreased mobility, decreased ROM, decreased strength, impaired flexibility, impaired UE functional use, and pain.   ACTIVITY LIMITATIONS: carrying, lifting, bathing, dressing, self feeding, reach  over head, hygiene/grooming, and locomotion level  PARTICIPATION LIMITATIONS: meal prep, cleaning, laundry, shopping, and community activity  PERSONAL FACTORS: Fitness, Past/current experiences, and Time since onset of injury/illness/exacerbation are also affecting patient's functional outcome.   REHAB POTENTIAL: Good  CLINICAL DECISION MAKING: Stable/uncomplicated  EVALUATION COMPLEXITY: Low   GOALS: Goals reviewed with patient? Yes  SHORT TERM GOALS: (target date for Short term goals are 3 weeks 09/14/2023)  1.Patient will demonstrate independent use of home exercise program to maintain progress from in clinic treatments. Goal status: Met   LONG TERM GOALS: (target dates for all long term goals are 10 weeks  11/02/2023)   1. Patient will demonstrate/report pain at worst less than or equal to 2/10 to facilitate minimal limitation in daily activity secondary to pain symptoms. Goal status: on going 09/03/2023   2. Patient will demonstrate independent use of home exercise program to facilitate ability to maintain/progress functional gains from skilled physical therapy services. Goal status: on going 09/03/2023   3. Patient will demonstrate Patient specific functional scale avg > or = 8 to indicate reduced disability due to condition.  Goal status: on going 09/03/2023   4.  Patient will  demonstrate Lt shoulder MMT 5/5 throughout to facilitate lifting, reaching, carrying at [facility] in daily activity.   Goal status: on going 09/03/2023   5.  Patient will demonstrate Lt shoulder AROM WFL s symptoms to facilitate usual overhead reaching, self care, dressing at PLOF.    Goal status: on going 09/03/2023  PLAN:  PT FREQUENCY: 1-2x/week  PT DURATION: 10 weeks  PLANNED INTERVENTIONS: Can include 02853- PT Re-evaluation, 97110-Therapeutic exercises, 97530- Therapeutic activity, 97112- Neuromuscular re-education, 97535- Self Care, 97140- Manual therapy, [phone]- Gait training, [phone]- Orthotic  Fit/training, [phone]- Canalith repositioning, [id number]- Aquatic Therapy, [phone]- Electrical stimulation (unattended), [id number] Physical performance testing, 97016- Vasopneumatic device, [id number]- Ultrasound, [id number]- Traction (mechanical), [id number]- Ionotophoresis 4mg /ml Dexamethasone,  20560 - Needle insertion w/o injection 1 or 2 muscles, 20561 - Needle insertion w/o injection 3 or more muscles.   Patient/Family education, Balance training, Stair training, Taping, Dry Needling, Joint mobilization, Joint manipulation, Spinal manipulation, Spinal mobilization, Scar mobilization, Vestibular training, Visual/preceptual remediation/compensation, DME instructions, Cryotherapy, and Moist heat.  All performed as medically necessary.  All included unless contraindicated  PLAN FOR NEXT SESSION: Recheck range progress.    Ozell Silvan, PT, DPT, OCS, ATC 09/10/23  11:45 AM  PHYSICAL THERAPY DISCHARGE SUMMARY  Visits from Start of Care: 4  Current functional level related to goals / functional outcomes: See note   Remaining deficits: See note   Education / Equipment: HEP  Patient goals were partially met. Patient is being discharged due to not returning since the last visit.  Ozell Silvan, PT, DPT, OCS, ATC 10/19/23  8:29 AM

## 2023-09-14 ENCOUNTER — Encounter: Admitting: Rehabilitative and Restorative Service Providers"

## 2023-09-17 ENCOUNTER — Encounter: Admitting: Rehabilitative and Restorative Service Providers"

## 2023-09-21 ENCOUNTER — Encounter: Admitting: Rehabilitative and Restorative Service Providers"

## 2023-09-24 ENCOUNTER — Encounter: Admitting: Rehabilitative and Restorative Service Providers"

## 2023-09-28 ENCOUNTER — Encounter: Admitting: Rehabilitative and Restorative Service Providers"

## 2023-09-30 ENCOUNTER — Encounter

## 2023-10-23 LAB — LAB REPORT - SCANNED
A1c: 5.7
EGFR: 77

## 2023-11-09 ENCOUNTER — Encounter: Payer: Self-pay | Admitting: Radiology

## 2023-11-24 ENCOUNTER — Ambulatory Visit: Admitting: Internal Medicine

## 2023-11-24 ENCOUNTER — Other Ambulatory Visit: Payer: Self-pay | Admitting: Internal Medicine

## 2023-11-24 ENCOUNTER — Encounter: Payer: Self-pay | Admitting: Internal Medicine

## 2023-11-24 VITALS — BP 112/72 | HR 70 | Temp 97.7°F | Ht 66.0 in | Wt 155.2 lb

## 2023-11-24 DIAGNOSIS — E7800 Pure hypercholesterolemia, unspecified: Secondary | ICD-10-CM

## 2023-11-24 DIAGNOSIS — R739 Hyperglycemia, unspecified: Secondary | ICD-10-CM

## 2023-11-24 DIAGNOSIS — Z0000 Encounter for general adult medical examination without abnormal findings: Secondary | ICD-10-CM | POA: Diagnosis not present

## 2023-11-24 DIAGNOSIS — R7301 Impaired fasting glucose: Secondary | ICD-10-CM | POA: Diagnosis not present

## 2023-11-24 DIAGNOSIS — Z9189 Other specified personal risk factors, not elsewhere classified: Secondary | ICD-10-CM

## 2023-11-24 NOTE — Addendum Note (Signed)
 Addended byBETHA CHARLETT HOWARD K on: 11/24/2023 12:17 PM   Modules accepted: Orders

## 2023-11-24 NOTE — Patient Instructions (Addendum)
 Suggest at next lab get lipo a  lipid nmr and  A1c testing   to follow  lipids and blood sugars.  Good to see you today . Exam is favorable.

## 2023-11-24 NOTE — Progress Notes (Signed)
 Plan  6 mos   labs to include A1c and lipoprofil nmr

## 2023-11-24 NOTE — Progress Notes (Unsigned)
 Chief Complaint  Patient presents with   Annual Exam    Pt reports she is not fast. Was seen with her OBGYn last month and had blood work done.     HPI: Patient  Cheryl Petersen  58 y.o. comes in today for Preventive Health Care visit  Labs done via [facility] Under care pt for shoulder  capsulitis  Health Maintenance  Topic Date Due   HIV Screening  Never done   Hepatitis C Screening  Never done   Hepatitis B Vaccines 19-59 Average Risk (1 of 3 - 19+ 3-dose series) Never done   Pneumococcal Vaccine: 50+ Years (1 of 1 - PCV) Never done   COVID-19 Vaccine (6 - 2025-26 season) 12/10/2023 (Originally 09/07/2023)   Cervical Cancer Screening (HPV/Pap Cotest)  02/24/2024 (Originally 08/22/2017)   Influenza Vaccine  04/05/2024 (Originally 08/07/2023)   Mammogram  08/23/2024 (Originally 07/07/2019)   DTaP/Tdap/Td (2 - Tdap) 01/08/2027   Colonoscopy  06/24/2027   Zoster Vaccines- Shingrix   Completed   HPV VACCINES  Aged Out   Meningococcal B Vaccine  Aged Out   Health Maintenance Review LIFESTYLE:  Exercise:   shoulder pt  walking  with dog a mile at least.  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   ROS:  GEN/ HEENT: No fever, significant weight changes sweats headaches vision problems hearing changes, CV/ PULM; No chest pain shortness of breath cough, syncope,edema  change in exercise tolerance. GI /GU: No adominal pain, vomiting, change in bowel habits. No blood in the stool. No significant GU symptoms. SKIN/HEME: ,no acute skin rashes suspicious lesions or bleeding. No lymphadenopathy, nodules, masses.  NEURO/ PSYCH:  No neurologic signs such as weakness numbness. No depression anxiety. IMM/ Allergy: No unusual infections.  Allergy .   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    Past Surgical History:  Procedure Laterality Date   CESAREAN SECTION     ENDOMETRIAL ABLATION     2011   NO PAST SURGERIES      Family History  Problem Relation Age of Onset   Healthy Mother    Hypertension Father    Hyperlipidemia Father    Cancer Maternal Grandmother        skin   Cancer Maternal Grandfather        skin    Social History   Socioeconomic History   Marital status: Married    Spouse name: Not on file   Number of children: Not on file   Years of education: Not on file   Highest education level: Not on file  Occupational History   Not on file  Tobacco Use   Smoking status: Never   Smokeless tobacco: Never  Substance and Sexual Activity   Alcohol use: Yes    Comment: OCC   Drug use: No   Sexual activity: Yes  Other Topics Concern   Not on file  Social History Narrative   Married   Relocated from  [place], [place]    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 (Patient not taking: Reported on 11/24/2023) 60 tablet 1   No facility-administered medications prior to visit.     EXAM:  BP 112/72 (BP Location: Left Arm, Patient Position: Sitting, Cuff Size: Normal)   Pulse 70   Temp 97.7 F (36.5 C) (Oral)   Ht 5' 6 (1.676 m)   Wt 155 lb 3.2 oz (70.4 kg)   SpO2 96%   BMI 25.05 kg/m   Body mass index is 25.05  kg/m. Wt Readings from Last 3 Encounters:  11/24/23 155 lb 3.2 oz (70.4 kg)  06/26/22 151 lb 6.4 oz (68.7 kg)  04/15/22 151 lb 6.4 oz (68.7 kg)    Physical Exam: Vital signs reviewed HZW:Uypd is a well-developed well-nourished alert cooperative    who appearsr stated age in no acute distress.  HEENT: normocephalic atraumatic , Eyes: PERRL EOM's full, conjunctiva clear, Nares: paten,t no deformity discharge or tenderness., Ears: no deformity EAC's clear TMs with normal landmarks. Mouth: clear OP, no lesions, edema.  Moist mucous membranes. Dentition in adequate repair. NECK: supple without masses, thyromegaly or bruits. CHEST/PULM:  Clear to auscultation and percussion breath sounds equal no wheeze , rales or rhonchi. No chest wall deformities or tenderness. Breast: normal by inspection . No dimpling, discharge, masses, tenderness or discharge . CV: PMI is nondisplaced, S1 S2 no gallops, murmurs, rubs. Peripheral pulses are full without delay.No JVD .  ABDOMEN: Bowel sounds normal nontender  No guard or rebound, no hepato splenomegal no CVA tenderness.   Extremtities:  No clubbing cyanosis or edema, no acute joint swelling or redness no focal atrophy NEURO:  Oriented x3, cranial nerves 3-12 appear to be intact, no obvious focal weakness,gait within normal limits no abnormal reflexes or asymmetrical SKIN: No acute rashes normal turgor, color, no bruising or petechiae. PSYCH: Oriented, good eye contact, no obvious depression anxiety, cognition and judgment appear normal. LN: no cervical axillaryadenopathy  Lab Results  Component Value Date   WBC 6.3 03/27/2022   HGB 15.0 03/27/2022   HCT 45 03/27/2022   PLT 341 03/27/2022   GLUCOSE 100 (H) 01/18/2019   CHOL 246 (A) 03/27/2022   TRIG 54 03/27/2022   HDL 79.50 01/18/2019   LDLCALC 128 (H) 01/18/2019   ALT 16 03/27/2022   AST 22 03/27/2022   NA 140 03/27/2022   K 5.1 03/27/2022   CL 104 01/18/2019   CREATININE 0.9 03/27/2022   BUN  10 03/27/2022   CO2 24 (A) 03/27/2022   TSH 0.79 03/27/2022   HGBA1C 5.6 03/27/2022    BP Readings from Last 3 Encounters:  11/24/23 112/72  06/26/22 118/78  04/15/22 130/76  See  lab resport scanned 10 17 25   Lab results reviewed with patient   ASSESSMENT AND PLAN:  Discussed the following assessment and plan:    ICD-10-CM   1. Visit for preventive health examination  Z00.00  2. Elevated LDL cholesterol level  E78.00      Return for fasting lab in 6 mos  fu yearly  dpending on results. .  Patient Care Team: Kaedin Hicklin, Apolinar POUR, MD as PCP - General (Internal Medicine) Joshua Sieving, MD (Dermatology) gyne Marget Lenis, MD as Consulting Physician (Obstetrics and Gynecology) Patient Instructions  Suggest at next lab get lipo a  lipid nmr and  A1c testing   to follow  lipids and blood sugars.  Good to see you today . Exam is favorable.     Essam Lowdermilk K. Darlis Wragg M.D.

## 2024-05-11 ENCOUNTER — Other Ambulatory Visit
